# Patient Record
Sex: Female | Born: 1980 | Race: White | Hispanic: No | Marital: Single | Smoking: Current every day smoker
Health system: Southern US, Community
[De-identification: ages and names within clinical notes are randomized; demographics above are authoritative.]

## PROBLEM LIST (undated history)

## (undated) DIAGNOSIS — F419 Anxiety disorder, unspecified: Secondary | ICD-10-CM

## (undated) DIAGNOSIS — Z8711 Personal history of peptic ulcer disease: Secondary | ICD-10-CM

## (undated) DIAGNOSIS — Z8719 Personal history of other diseases of the digestive system: Secondary | ICD-10-CM

## (undated) DIAGNOSIS — G4730 Sleep apnea, unspecified: Secondary | ICD-10-CM

## (undated) DIAGNOSIS — T4145XA Adverse effect of unspecified anesthetic, initial encounter: Secondary | ICD-10-CM

## (undated) DIAGNOSIS — T8859XA Other complications of anesthesia, initial encounter: Secondary | ICD-10-CM

## (undated) DIAGNOSIS — R011 Cardiac murmur, unspecified: Secondary | ICD-10-CM

## (undated) HISTORY — PX: TUBAL LIGATION: SHX77

## (undated) HISTORY — PX: OTHER SURGICAL HISTORY: SHX169

## (undated) HISTORY — PX: BACK SURGERY: SHX140

---
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
## 2001-01-24 ENCOUNTER — Ambulatory Visit (HOSPITAL_COMMUNITY): Admission: RE | Admit: 2001-01-24 | Discharge: 2001-01-24 | Payer: Self-pay | Admitting: Obstetrics and Gynecology

## 2001-01-24 ENCOUNTER — Encounter: Payer: Self-pay | Admitting: Obstetrics and Gynecology

## 2001-03-11 ENCOUNTER — Encounter: Payer: Self-pay | Admitting: Obstetrics and Gynecology

## 2001-03-11 ENCOUNTER — Ambulatory Visit (HOSPITAL_COMMUNITY): Admission: RE | Admit: 2001-03-11 | Discharge: 2001-03-11 | Payer: Self-pay | Admitting: Obstetrics and Gynecology

## 2001-03-21 ENCOUNTER — Inpatient Hospital Stay (HOSPITAL_COMMUNITY): Admission: AD | Admit: 2001-03-21 | Discharge: 2001-03-25 | Payer: Self-pay | Admitting: Obstetrics and Gynecology

## 2001-03-21 ENCOUNTER — Encounter: Payer: Self-pay | Admitting: Obstetrics and Gynecology

## 2001-05-16 ENCOUNTER — Inpatient Hospital Stay (HOSPITAL_COMMUNITY): Admission: AD | Admit: 2001-05-16 | Discharge: 2001-05-19 | Payer: Self-pay | Admitting: Obstetrics and Gynecology

## 2001-09-17 ENCOUNTER — Other Ambulatory Visit: Admission: RE | Admit: 2001-09-17 | Discharge: 2001-09-17 | Payer: Self-pay | Admitting: Obstetrics and Gynecology

## 2001-11-01 ENCOUNTER — Emergency Department (HOSPITAL_COMMUNITY): Admission: EM | Admit: 2001-11-01 | Discharge: 2001-11-01 | Payer: Self-pay | Admitting: Emergency Medicine

## 2002-11-25 ENCOUNTER — Other Ambulatory Visit: Admission: RE | Admit: 2002-11-25 | Discharge: 2002-11-25 | Payer: Self-pay | Admitting: Obstetrics and Gynecology

## 2003-02-18 ENCOUNTER — Encounter (INDEPENDENT_AMBULATORY_CARE_PROVIDER_SITE_OTHER): Payer: Self-pay | Admitting: Specialist

## 2003-02-18 ENCOUNTER — Ambulatory Visit (HOSPITAL_BASED_OUTPATIENT_CLINIC_OR_DEPARTMENT_OTHER): Admission: RE | Admit: 2003-02-18 | Discharge: 2003-02-18 | Payer: Self-pay | Admitting: Surgery

## 2003-02-18 ENCOUNTER — Ambulatory Visit (HOSPITAL_COMMUNITY): Admission: RE | Admit: 2003-02-18 | Discharge: 2003-02-18 | Payer: Self-pay | Admitting: Surgery

## 2004-03-03 ENCOUNTER — Emergency Department (HOSPITAL_COMMUNITY): Admission: EM | Admit: 2004-03-03 | Discharge: 2004-03-03 | Payer: Self-pay | Admitting: Emergency Medicine

## 2004-04-26 ENCOUNTER — Other Ambulatory Visit: Admission: RE | Admit: 2004-04-26 | Discharge: 2004-04-26 | Payer: Self-pay | Admitting: Obstetrics and Gynecology

## 2004-05-04 ENCOUNTER — Ambulatory Visit (HOSPITAL_COMMUNITY): Admission: RE | Admit: 2004-05-04 | Discharge: 2004-05-04 | Payer: Self-pay | Admitting: Obstetrics and Gynecology

## 2005-05-03 ENCOUNTER — Other Ambulatory Visit: Admission: RE | Admit: 2005-05-03 | Discharge: 2005-05-03 | Payer: Self-pay | Admitting: Obstetrics and Gynecology

## 2005-07-12 ENCOUNTER — Inpatient Hospital Stay (HOSPITAL_COMMUNITY): Admission: AD | Admit: 2005-07-12 | Discharge: 2005-07-12 | Payer: Self-pay | Admitting: Obstetrics and Gynecology

## 2005-09-14 ENCOUNTER — Inpatient Hospital Stay (HOSPITAL_COMMUNITY): Admission: AD | Admit: 2005-09-14 | Discharge: 2005-09-14 | Payer: Self-pay | Admitting: Obstetrics and Gynecology

## 2005-11-09 ENCOUNTER — Inpatient Hospital Stay (HOSPITAL_COMMUNITY): Admission: AD | Admit: 2005-11-09 | Discharge: 2005-11-09 | Payer: Self-pay | Admitting: Obstetrics and Gynecology

## 2005-11-12 ENCOUNTER — Inpatient Hospital Stay (HOSPITAL_COMMUNITY): Admission: AD | Admit: 2005-11-12 | Discharge: 2005-11-15 | Payer: Self-pay | Admitting: Obstetrics and Gynecology

## 2005-11-13 ENCOUNTER — Encounter (INDEPENDENT_AMBULATORY_CARE_PROVIDER_SITE_OTHER): Payer: Self-pay | Admitting: *Deleted

## 2007-08-21 ENCOUNTER — Encounter: Admission: RE | Admit: 2007-08-21 | Discharge: 2007-08-21 | Payer: Self-pay | Admitting: Orthopedic Surgery

## 2008-12-05 ENCOUNTER — Emergency Department (HOSPITAL_COMMUNITY): Admission: EM | Admit: 2008-12-05 | Discharge: 2008-12-05 | Payer: Self-pay | Admitting: Emergency Medicine

## 2008-12-05 ENCOUNTER — Emergency Department (HOSPITAL_COMMUNITY): Admission: EM | Admit: 2008-12-05 | Discharge: 2008-12-05 | Payer: Self-pay | Admitting: Family Medicine

## 2009-05-25 ENCOUNTER — Emergency Department (HOSPITAL_COMMUNITY): Admission: EM | Admit: 2009-05-25 | Discharge: 2009-05-25 | Payer: Self-pay | Admitting: Family Medicine

## 2009-10-05 ENCOUNTER — Emergency Department (HOSPITAL_COMMUNITY): Admission: EM | Admit: 2009-10-05 | Discharge: 2009-10-05 | Payer: Self-pay | Admitting: Emergency Medicine

## 2010-04-23 ENCOUNTER — Emergency Department (HOSPITAL_COMMUNITY)
Admission: EM | Admit: 2010-04-23 | Discharge: 2010-04-23 | Payer: Self-pay | Source: Home / Self Care | Admitting: Family Medicine

## 2010-08-19 ENCOUNTER — Emergency Department (HOSPITAL_COMMUNITY)
Admission: EM | Admit: 2010-08-19 | Discharge: 2010-08-20 | Disposition: A | Discharge status: Home / Self Care | Payer: 59 | Attending: Emergency Medicine | Admitting: Emergency Medicine

## 2010-08-19 DIAGNOSIS — Y92009 Unspecified place in unspecified non-institutional (private) residence as the place of occurrence of the external cause: Secondary | ICD-10-CM | POA: Insufficient documentation

## 2010-08-19 DIAGNOSIS — S51809A Unspecified open wound of unspecified forearm, initial encounter: Secondary | ICD-10-CM | POA: Insufficient documentation

## 2010-08-19 DIAGNOSIS — W268XXA Contact with other sharp object(s), not elsewhere classified, initial encounter: Secondary | ICD-10-CM | POA: Insufficient documentation

## 2010-08-20 LAB — COMPREHENSIVE METABOLIC PANEL
ALT: 29 U/L (ref 0–35)
ALT: 32 U/L (ref 0–35)
AST: 19 U/L (ref 0–37)
Albumin: 3.6 g/dL (ref 3.5–5.2)
Alkaline Phosphatase: 55 U/L (ref 39–117)
Alkaline Phosphatase: 70 U/L (ref 39–117)
BUN: 16 mg/dL (ref 6–23)
Calcium: 7.7 mg/dL — ABNORMAL LOW (ref 8.4–10.5)
Chloride: 106 mEq/L (ref 96–112)
GFR calc Af Amer: >60 mL/min (ref >60)
Glucose, Bld: 106 mg/dL — ABNORMAL HIGH (ref 70–99)
Glucose, Bld: 93 mg/dL (ref 70–99)
Potassium: 3.7 mEq/L (ref 3.5–5.1)
Potassium: 3.7 mEq/L (ref 3.5–5.1)
Sodium: 136 mEq/L (ref 135–145)
Sodium: 136 mEq/L (ref 135–145)
Total Bilirubin: 0.8 mg/dL (ref 0.3–1.2)
Total Protein: 6.1 g/dL (ref 6.0–8.3)

## 2010-08-20 LAB — DIFFERENTIAL
Basophils Absolute: 0 10*3/uL (ref 0.0–0.1)
Basophils Relative: 0 % (ref 0–1)
Basophils Relative: 0 % (ref 0–1)
Eosinophils Absolute: 0 10*3/uL (ref 0.0–0.7)
Eosinophils Absolute: 0 10*3/uL (ref 0.0–0.7)
Eosinophils Relative: 0 % (ref 0–5)
Lymphs Abs: 0.6 10*3/uL — ABNORMAL LOW (ref 0.7–4.0)
Monocytes Absolute: 0.3 10*3/uL (ref 0.1–1.0)
Monocytes Absolute: 0.4 10*3/uL (ref 0.1–1.0)
Monocytes Relative: 3 % (ref 3–12)
Monocytes Relative: 6 % (ref 3–12)
Neutro Abs: 9.5 10*3/uL — ABNORMAL HIGH (ref 1.7–7.7)
Neutrophils Relative %: 86 % — ABNORMAL HIGH (ref 43–77)
Neutrophils Relative %: 90 % — ABNORMAL HIGH (ref 43–77)

## 2010-08-20 LAB — CBC
HCT: 47 % — ABNORMAL HIGH (ref 36.0–46.0)
Hemoglobin: 13.6 g/dL (ref 12.0–15.0)
Hemoglobin: 16 g/dL — ABNORMAL HIGH (ref 12.0–15.0)
MCHC: 34.2 g/dL (ref 30.0–36.0)
RBC: 4.18 MIL/uL (ref 3.87–5.11)
RBC: 4.95 MIL/uL (ref 3.87–5.11)
RDW: 13 % (ref 11.5–15.5)
WBC: 10.5 10*3/uL (ref 4.0–10.5)

## 2010-09-29 NOTE — Op Note (Signed)
NAMEMARIONETTE, [name]                 ACCOUNT NO.:  [id number]   MEDICAL RECORD NO.:  [id number]          PATIENT TYPE:  INP   LOCATION:  9148                          FACILITY:  [facility]   PHYSICIAN:  Naima A. Dillard, M.D. DATE OF BIRTH:  [birth year]   DATE OF PROCEDURE:  11/13/2005  DATE OF DISCHARGE:                                 OPERATIVE REPORT   PREOPERATIVE DIAGNOSIS:  Multiparity, desires postpartum tubal ligation.   POSTOPERATIVE DIAGNOSIS:  Multiparity, desires postpartum tubal ligation.   PROCEDURE:  Postpartum tubal ligation.   SURGEON:  Naima A. Normand Sloop, M.D.   ANESTHESIA:  General endotracheal anesthesia.   ESTIMATED BLOOD LOSS:  Minimal.   COMPLICATIONS:  None.   SPECIMENS:  Portions of left and right tubes sent to pathology.   DISPOSITION:  Patient to the recovery room in stable condition.   PROCEDURE IN DETAIL:  Before the procedure, the patient was told the risks  of but not limited to bleeding, infection, damage to internal organs such  bowel, bladder, and major blood vessels, and problems with anesthesia.  She  was also told the risks of failure of about 1 to 200 to 1 in 300 and the  patient agreed to the procedure.   The patient was taken to the operating room where her epidural anesthesia  was not found to be adequate.  She was given general anesthesia with  endotracheal tube.  She was placed in the dorsal lithotomy position and  prepped and draped in a normal sterile fashion.  An infraumbilical incision  was made with the scalpel after 0.5% Bupivacaine with epinephrine was  injected, 5 mL, and taken down to the fascia.  The fascia was then incised  and extended bilaterally.  The peritoneum was identified, entered sharply,  and extended bilaterally.  The patient's bowel was very prominent, so two  Deavers were placed in the abdomen to move away the bowel.  The patient's  right fallopian tube was then grasped with a Babcock clamp, followed out to  its  fimbriated end, and about a cm of the midisthmic portion of the tube was  ligated with 2-0 plain and excised.  Hemostasis was assured.  The patient's  bowel was then repacked.  Attention was turned to the left fallopian tube.  This was difficult to see because of the bowel in the way.  Two small  Deavers were used thus packing the bowel and finally the left tube was seen,  grasped with Babcock clamps, and followed out to the fimbriated end.  The  mid isthmic portion of the tube, about 1 cm, was ligated with 2-0 plain and  excised.  Hemostasis was assured.  The tubes were placed back in the  abdomen.  The instruments were  removed.  The fascia was closed with 0 Vicryl in a running fashion.  The  skin was closed with 3-0 Monocryl in a subcuticular fashion.  Sponge, lap,  and needle counts were correct.  The patient went to the recovery room in  stable condition.      Naima A. Normand Sloop, M.D.  Electronically  Signed     NAD/MEDQ  D:  11/13/2005  T:  11/13/2005  Job:  [id number]

## 2010-09-29 NOTE — H&P (Signed)
[name], [name]                 ACCOUNT NO.:  [id number]   MEDICAL RECORD NO.:  [id number]          PATIENT TYPE:  INP   LOCATION:  9162                          FACILITY:  [facility]   PHYSICIAN:  Osborn Coho, M.D.   DATE OF BIRTH:  10/10/1980   DATE OF ADMISSION:  11/12/2005  DATE OF DISCHARGE:                                HISTORY & PHYSICAL   HISTORY OF PRESENT ILLNESS:  [name] is a 30 year old gravida 4, para 1-0-  2-1 at 17 and 2/7ths weeks who presented from the office with spontaneous  rupture of membranes at approximately 3:30 p.m. with clear fluid noted.  She  was having uterine contractions approximately every 6 minutes.  Her cervix  was 3 cm, 60%, vertex -2 in the office.   The pregnancy has been remarkable for:  1.  Smoker.  2.  TAB x2.  3.  History of sleep apnea, now resolved.  4.  Mild asthma.  5.  Migraines.  6.  PENICILLIN allergy.   PRENATAL LABORATORIES:  Blood type is A positive.  Rh antibody negative.  VDRL non-reactive.  Rubella titer positive.  Hepatitis B surface antigen  negative.  Cystic fibrosis testing was negative.  HIV was declined.  Pap  smear was normal.  GC and Chlamydia cultures were negative.  Quadruple  screen was declined.  Glucola was normal.  Hemoglobin upon entering the  practice was 12.6 (it was 11.3 at 26 weeks).  Group B strep culture was  negative at [redacted] weeks along with other cultures.   Estimated date of confinement of November 17, 2005 was established by a 10-week  ultrasound and was in agreement with ultrasound at approximately 18 weeks.   HISTORY OF PRESENT PREGNANCY:  The patient entered care at approximately 10  weeks.  She had an ultrasound at that time for dating.  She was treat for BV  at that visit.  She had an 18-week ultrasound which showed normal growth and  development, but a posterior previa.  She declined quadruple screen.  She  began to have headaches at approximately 19 weeks.  She was evaluated by a  neurologist  and was placed on Vicodin.  At 22 weeks, she had another  ultrasound showing her previa resolved.  She did have some gum hyperplasia  noted at 22 weeks.  She did seen a periodontist, and this was anticipated to  resolve after delivery.  Her Glucola was normal.  The headaches continued  during her pregnancy.  She did have some pica of ice, but this seemed to be  under reasonable control.  Group B strep culture was negative.  She was seen  here at maternity admissions last week for a labor check.  At that time, she  was 3 cm.  Her cervix did not change, and she was sent home.   OBSTETRICAL HISTORY:  1.  In 1999, she had a therapeutic termination of pregnant that is      confidential.  She did have depression following that.  2.  In 2003, she had a vaginal delivery of a female infant,  weight 8 pounds,      2 ounces, at 40 weeks.  She was in labor 16 hours.  She was diagnosed      with obstructive sleep apnea during that pregnancy.  3.  In 2005, she had a therapeutic termination of pregnancy at 5 weeks that      is confidential.   MEDICAL HISTORY:  She was on Ortho Evra in the past.  She discontinued this  in November of 2006.  She did conceive on Ortho Evra.  She was on  antibiotics.   GYNECOLOGICAL SURGERY:  1.  The previously-noted TAB x2.  2.  She had her wisdom teeth removed in 2002.  3.  Plantar's wart removed off her right foot in 1999.   MEDICAL HISTORY:  1.  She reports the usual childhood illnesses.  2.  In 2003, she was diagnosed with asthma, but has not required any      medication.  3.  She has occasional urinary tract infections.  4.  She had pyelonephritis in 1999.  5.  Hospitalization for childbirth.   SOCIAL HISTORY:  She has been a smoker since age 66.  The patient is single.  Father of the baby is involved and supportive.  His name is Shadaya Marschner.  The patient is high school educated.  She is a Research scientist (medical).  Her partner is also high school educated.  He  is employed in some type of  production.  She has been followed by the certified nurse midwife service a  [place].  She denies any alcohol or drug use during this  pregnancy.  She has been a smoker.   FAMILY HISTORY:  Her mother, father and maternal grandmother have chronic  hypertension.  Her mother has varicosities.  Her sister has anemia and  questionable low platelets.  Maternal grandmother had emphysema.  Her sister  also has frequent pyelonephritis and migraines.  Her maternal grandfather  had lung cancer and is now deceased.  There are numerous family members who  smoke.  There are several family members who drink alcohol to excess.   GENETIC HISTORY:  Remarkable for the patient's cousin having muscular  dystrophy.   ALLERGIES:  She is allergic to PENICILLIN, which causes a rash.   PHYSICAL EXAMINATION:  VITAL SIGNS:  Stable.  The patient is afebrile.  HEENT:  Within normal limits.  LUNGS:  Her breath sounds are clear.  HEART:  Regular rate and rhythm without murmur.  BREASTS:  Soft and nontender.  ABDOMEN:  Fundal height is approximately 38 cm.  Estimated fetal weight is  7.5 to 8 pounds.  Uterine contractions are every 6 minutes, mild to moderate  in quality.  PELVIC:  Cervical exam in the office was 3 cm, 60%, vertex at a -2 station,  by [employer].  The patient is noted to be leaking clear fluid.  Fetal heart  rate is reactive with no decelerations.  EXTREMITIES:  Deep tendon reflexes are 2+ without clonus.  There is a trace  edema noted.   IMPRESSION:  1.  Intrauterine pregnancy at 52 and 2/7ths weeks.  2.  Early labor with spontaneous rupture of membranes.  3.  Negative group B strep.   PLAN:  1.  Admit to birthing suite for consultation with Dr. Su Hilt as attending      physician.  2.  Routine certified nurse midwife orders.  3.  Will reevaluate in 1-2 hours for possible Pitocin augmentation. 4.  The patient  will plan epidural for labor.       Taylor Huff, C.N.M.      Osborn Coho, M.D.  Electronically Signed    VLL/MEDQ  D:  11/12/2005  T:  11/12/2005  Job:  [id number]

## 2010-09-29 NOTE — Discharge Summary (Signed)
[facility]  Patient:    Taylor Huff, Taylor Huff Visit Number: [id number] MRN: [id number]          Service Type: OBS Location: 910B 9162 01 Attending Physician:  Shaune Spittle Dictated by:   Pierre Bali Normand Sloop, M.D. Admit Date:  03/21/2001 Discharge Date: 03/25/2001                             Discharge Summary  DIAGNOSES:                    1. Upper respiratory infection/bronchitis at 32+                                  weeks of pregnancy.                               2. Sleep apnea.  HISTORY OF PRESENT ILLNESS:   The patient is a 30 year old G2 P0, 0-1-0, who presented at 32 weeks with a 24 hour history of nausea, vomiting, and chest pressure, also complaining of just congestion and upper respiratory infection with a temperature of about 100.9 degrees at home.  HOSPITAL COURSE:              The patient was brought in with pneumonia versus rule out PE.  Her ABG was found to be normal.  Chest x-ray was within normal limits.  Her WBC, however, was 20,000 with a left shift.  The patient was thus started on Rocephin and Zithromax, and she defervesced with antibiotics. Throughout her hospital stay the baby remained reactive on the fetal monitor. On hospital day #3 the patient complained of some head congestion and nonproductive cough.  Her O2 saturation ranged from 89-97% while sleeping and while awake was 94-96%.  Because of the desaturation to 89% she was given 3 liters nasal cannula.  The patient said she felt better and had no chest pain or shortness of breath; however, secondary to her continuing to desaturate despite antibiotics, pulmonary consultation was obtained.  The pulmonologist agreed with treatment and felt that she had sleep-related O2 desaturation secondary to nasal congestion of URI plus gravid shallow respiration, and also felt she may have sleep apnea.  DISPOSITION:                  The patient was discharged home to receive  CPAP at home.  She is to follow up with [place] on Thursday, March 27, 2001, in the office.  She was given respiratory precautions and PML precautions, and sent home. Dictated by:   Pierre Bali. Normand Sloop, M.D. Attending Physician:  Shaune Spittle DD:  03/25/01 TD:  03/26/01 Job: [id number]

## 2010-09-29 NOTE — Discharge Summary (Signed)
NAMEABBEYGAIL, IGOE                 ACCOUNT NO.:  [id number]   MEDICAL RECORD NO.:  [id number]          PATIENT TYPE:  INP   LOCATION:  9162                          FACILITY:  [facility]   PHYSICIAN:  Osborn Coho, M.D.   DATE OF BIRTH:  1981/02/28   DATE OF ADMISSION:  11/12/2005  DATE OF DISCHARGE:                                 DISCHARGE SUMMARY   Ms. Memory Argue is a 30 year old gravida 4, para 1-0-2-1 at 60 and 4/7 weeks who  presented from the office with spontaneous rupture of membranes at  approximately 3:30 p.m. with clear fluid noted. Irregular contractions were  noted.  Cervix in the office is 3 cm, 60%, vertex and a -2. Pregnancy has  been remarkable for:  1) smoker; 2) TAB x2; 3) history of sleep apnea, now  resolved; 4) mild asthma; 5) migraines.   Prenatal labs:  Blood type is A positive, Rh antibody negative, VDRL  nonreactive, rubella titer positive, hepatitis B surface antigen negative,  cystic fibrosis testing was negative, HIV was declined.  GC and Chlamydia  cultures were negative in the first trimester and at 36 weeks. Pap was  normal.  Group B strep culture was negative at 36 weeks, Glucola was normal.  Quadruple screen was declined.  EDC of   Dictation ended at this point.      Renaldo Reel Emilee Hero, C.N.M.      Osborn Coho, M.D.  Electronically Signed    VLL/MEDQ  D:  11/12/2005  T:  11/12/2005  Job:  [id number]

## 2010-09-29 NOTE — Discharge Summary (Signed)
[name], [name]                 ACCOUNT NO.:  [id number]   MEDICAL RECORD NO.:  [id number]          PATIENT TYPE:  INP   LOCATION:  9148                          [facility]  [facility]   PHYSICIAN:  Crist Fat. Rivard, M.D. DATE OF BIRTH:  [birth year]   DATE OF ADMISSION:  11/12/2005  DATE OF DISCHARGE:  11/15/2005                                 DISCHARGE SUMMARY   ADMITTING DIAGNOSES:  1.  Intrauterine pregnancy at term, active labor.  2.  Desires postpartum tubal ligation.   DISCHARGE DIAGNOSES:  1.  Intrauterine pregnancy at term, delivered.  2.  Status post normal spontaneous vaginal delivery.  3.  Postpartum sterilization.   PROCEDURES:  1.  Spontaneous vaginal birth.  2.  Repair of first-degree perineal and right labial laceration.  3.  Postpartum bilateral tubal ligation.   HOSPITAL COURSE:  Mrs. Taylor Huff is a 30 year old gravida 4, para 1-0-2-1 who  presented with spontaneous rupture of membranes in early labor on November 12, 2005.  Patient had been remarkable for:  1.  Tobacco use.  2.  TAB x2.  3.  History of sleep apnea.  4.  Mild asthma.  5.  History of migraines.  6.  Desires bilateral tubal ligation.   Patient progressed to complete dilation and underwent spontaneous vaginal  birth of a viable female infant, Taylor Huff, with Apgars of 9 and 9 and weight 7  pounds 14 ounces.  Patient is bottle feeding.  Hemoglobin was 8.9 on  postpartum day #1, which was decreased from 9.8 on admission.  Patient  desired postpartum bilateral tubal ligation and this was performed by Dr.  Normand Sloop.  No complications from postpartum tubal ligation.  Circumcision to  be performed by Dr. Dois Davenport Rivard.   By postpartum day #2, patient was doing well and deemed to have received the  full benefit of her hospital stay.  She was discharged home.   DISCHARGE CONDITION:  Stable.  Discharge instructions per [facility] handout.   DISCHARGE MEDICATIONS:  1.  Motrin 600 mg p.o. q.6 h. p.r.n.  pain.  2.  Tylox 1-2 tabs p.o. q.4-6 h, p.r.n. pain.  3.  Prenatal vitamins 1 tab daily.  4.  Tandem F 1 tablet daily.   Discharge followup will occur at 6 weeks' postpartum at [facility].      Rhona Leavens, CNM      Crist Fat Rivard, M.D.  Electronically Signed    NOS/MEDQ  D:  11/15/2005  T:  11/15/2005  Job:  [id number]

## 2010-09-29 NOTE — H&P (Signed)
[facility]  Patient:    Taylor Huff, Taylor Huff Visit Number: [id number] MRN: [id number]          Service Type: OBS Location: MATC Attending Physician:  Shaune Spittle Dictated by:   Nigel Bridgeman, C.N.M. Admit Date:  03/21/2001                           History and Physical  HISTORY OF PRESENT ILLNESS:   The patient is a 30 year old gravida 2, para 0-0-1-0, at 32 weeks who presents with 24 hour history of nausea, vomiting, and upper respiratory symptoms.  Now with onset of shortness of breath today and chest pressure.  No hemoptysis is noted.  Positive nasal congestion and sore throat are reported by the patient.  There is another child in the home who has an upper respiratory infection.  The patient denies uterine contractions, cramping, dysuria, or leg pain.  She has been unable to keep food or fluids down for 24 hours.  Her temperature was 100.9 at home. Pregnancy has been remarkable for:  1. Previous smoker before pregnancy. 2. Questionable dates with EDC by seven week ultrasound. 3. Echogenic intracardiac focus on ultrasound. 4. Penicillin allergy with rash as a child. 5. Questionable pyelo approximately three years ago.  PRENATAL LABORATORY DATA:     Blood type is A-positive, Rh antibody negative. VDRL nonreactive.  Rubella titer positive.  Hepatitis B surface antigen negative.  HIV nonreactive.  GC and Chlamydia cultures are negative.  Pap was normal.  Glucose challenge was normal.  EDC of May 16, 2001 was established by seven week ultrasound secondary to questionable LMP.  This was also confirmed by ultrasound at 18 weeks.  HISTORY OF PRESENT PREGNANCY: The patient entered care at approximately seven to eight weeks.  She had an ultrasound during her first visit secondary to size less less than dates, which established an [facility] of May 16, 2001.  She had an ultrasound at 18 weeks which showed a left ventricle with an  echogenic intracardiac focus.  She had a follow up ultrasound at approximately 24 weeks which showed the same.  She had another ultrasound on March 11, 2001, that showed the echogenic intracardiac focus was still present, but was less prominent.  Growth at 26 weeks showed greater than the 90th percentile.  This did resolve to the 75th percentile at the last ultrasound at approximately 30 weeks.  The rest of her pregnancy was essentially uncomplicated.  OBSTETRICAL HISTORY:          In 1999, she had a pregnancy termination that is confidential at seven to eight weeks gestation.  PAST MEDICAL HISTORY:         She conceived on OCPs, and was on antibiotics at the time.  She had an abnormal Pap in June of 2001, but the repeat was normal. Reports yeast infections x 1.  She has a history of usual childhood illnesses. She has a history of one or two urinary tract infections per year and a questionable kidney infection three years ago.  This was outpatient treated with oral antibiotics.  PAST SURGICAL HISTORY:        Includes wisdom teeth removed two to three months prior to pregnancy, and plantar warts removed from her right foot. The patients other surgical history includes a previous termination of pregnancy.  ALLERGIES:                    PENICILLIN  which caused a rash as a child.  FAMILY HISTORY:               Her mother, father, and maternal grandfather all have hypertension.  Her mother has varicosities.  Her sister has anemia and questionable low platelets.  Her maternal grandmother had emphysema.  Her sister has frequent pyelonephritis.  Her sister also has migraines.  Her maternal grandfather had lung cancer and is now deceased.  Her maternal grandmother is a smoker and alcohol user, and maternal uncle is also an alcohol user.  GENETIC HISTORY:              Remarkable for a maternal first cousin having Down syndrome and died at age 29.  SOCIAL HISTORY:               The patient  is single.  The father of the baby is involved.  His name is Christena Deem.  She is Caucasian.  She has a 10th grade education.  She is employed at [employer].  Her partner has some college and is employed with a Education officer, environmental.  She has been followed by the certified nurse midwife service at [facility].  She denies any alcohol, drug, or tobacco use since her positive urine pregnancy test.  PHYSICAL EXAMINATION:  VITAL SIGNS:                  Temperature is 98.9, pulse 127 to 137, respirations 24 to 30, O2 sat 97% on room air, blood pressure is 120/60.  HEENT:                        Remarkable for slightly reddened throat.  Ears are within normal limits.  Nares are normal.  HEART:                        Tachycardia, but a regular rate and rhythm without murmur.  CHEST:                        Bilateral breath sounds are clear, but are somewhat diminished in the lower lobes.  ABDOMEN:                      Soft and nontender, gravid with approximately 32 weeks size.  Fetal heart rate is reassuring.  Uterine irritability is noted.  CERVICAL EXAMINATION:         Pending.  Negative CVA tenderness is noted.  EXTREMITIES:                  Deep tendon reflexes are 2+ without clonus. There is a trace edema noted.  Negative Homans sign is noted.  LABORATORY DATA:              CBC shows WBC count of 20 with a hemoglobin of 10.9, hematocrit of 30.5, and platelets of 247.  Neutrophils are 85 and elevated.  Granulocytes were 17 and elevated.  Clean catch urine is pending. Comprehensive metabolic is pending.  IMPRESSION:                   1. Intrauterine pregnancy at 32 weeks.                               2. Questionable pneumonia, questionable rule out  pulmonary embolus.                               3. Elevated white blood cell count.  PLAN:                          1. Admit for 23 hour observation for consult                                   with  Dr. Normand Sloop as attending physician.                                2. ABGs.                                3. Chest x-ray, AP and lateral.                                 4. Breathing treatment with albuterol.                                5. Zithromax 500 mg p.o. q.d.                                6. Rocephin 1 g IV q.24.                                7. EKG.                                8. Diet as tolerated, would push p.o. fluids.                                9. Continuous _______ fetal monitoring.                               10. MDs will follow. Dictated by:   Nigel Bridgeman, C.N.M. Attending Physician:  Shaune Spittle DD:  03/21/01 TD:  03/21/01 Job: [id number]

## 2010-09-29 NOTE — H&P (Signed)
[facility]  Patient:    Taylor Huff, Taylor Huff Visit Number: [id number] MRN: [id number]          Service Type: OBS Location: 910B 9154 01 Attending Physician:  Shaune Spittle Dictated by:   Nigel Bridgeman, C.N.M. Admit Date:  05/16/2001                           History and Physical  HISTORY:                      [name] is a 30 year old gravida 2, para 0-0-1-0 at 40 weeks who presents from office with spontaneous ruptured membranes approximately 8:30 a.m. this morning with clear fluid noted and uterine contractions every four to eight minutes.  Cervix was 2 cm in the office.  Pregnancy has been remarkable for conception on OCPs, echogenic intracardiac focus on left ventricle via ultrasound, questionable LGA on ultrasound at 26 weeks, but growth at the 75th percentile at 30 weeks, unsure dates, allergy to penicillin.  PRENATAL LABORATORIES:        Blood type A+.  Rh antibody negative.  VDRL nonreactive.  Rubella titer positive.  Hepatitis B surface antigen negative. HIV nonreactive.  GC and chlamydia cultures negative.  Pap normal.  Glucose challenge normal.  AFP is not noted on her chart.  Group B Strep culture was negative at 36 weeks.  [facility] May 16, 2001 was established by 7 week ultrasound and was in agreement with ultrasound at approximately 18 weeks. Hemoglobin upon entry into practice was 13.2.  It was within normal limits at 28 weeks.  HISTORY OF PRESENT PREGNANCY: Patient entered care at approximately 7 weeks by ultrasound.  She had questionable LMP, therefore an ultrasound was done at that 7 week time period.  She had another ultrasound at 18 weeks which showed an echogenic intracardiac focus in the left ventricle.  A follow-up ultrasound was again at 30 weeks.  The rest of her pregnancy was essentially uncomplicated.  PAST OBSTETRICAL HISTORY:     In 1999 she had a therapeutic termination of pregnancy which is confidential.  She  did have some depression following that.  PAST MEDICAL HISTORY:         She conceived on OCPs with antibiotic therapy. She had an abnormal Pap in June 2001 with a repeat normal.  She has occasional yeast infections.  She reports the usual childhood illnesses.  She did have shingles just prior to pregnancy.  She has had one to two UTIs per year.  She had pyelonephritis three years ago.  PAST SURGICAL HISTORY:        Therapeutic termination of pregnancy, wisdom teeth removed two to three months prior to pregnancy, plantar warts removed from her right foot.  ALLERGIES:                    PENICILLIN which causes a rash.  FAMILY HISTORY:               Her mother, father, and maternal grandmother have hypertension.  Her mother has varicosities.  Her sister has anemia and questionable issue with platelets.  Maternal grandmother has emphysema. Sister has frequent pyelonephritis.  Sister has migraines.  Maternal grandfather had lung cancer and is now deceased.  Maternal grandmother is a smoker and an alcohol user.  Maternal uncle is also an alcohol user.  GENETIC HISTORY:  Remarkable for maternal first cousin dying at age 63 with Down syndrome and questionable muscular dystrophy.  SOCIAL HISTORY:               Patient is single.  The father of the baby is involved and supportive.  His name is Taylor Huff.  Patient is also supported by her mother and her sister.  She is Caucasian.  She has a 10th grade education.  She is employed at [employer].  Her partner has a Naval architect.  He is employed at a [employer]/textile [employer].  She has been followed by the certified nurse midwife service at [facility].  She denies any alcohol, drug, or tobacco use during this pregnancy.  She was a previous smoker prior to pregnancy.  PHYSICAL EXAMINATION  VITAL SIGNS:                  Stable.  Patient is afebrile.  HEENT:                        Within normal limits.  LUNGS:                         Bilateral breath sounds are clear.  HEART:                        Regular rate and rhythm without murmur.  BREASTS:                      Soft and nontender.  ABDOMEN:                      Fundal height is 38 cm.  Estimated fetal weight 7.5-8 pounds.  Uterine contractions are every four to eight minutes, mild quality.  Patient is leaking clear fluid.  PELVIC:                       Cervical examination is deferred at present secondary to being performed in the office with 2 cm, 80%, vertex presentation at a -1 station.  Fetal heart rate is reactive with no decelerations.  EXTREMITIES:                  Deep tendon reflexes are 2+ without clonus. There is a trace edema noted.  IMPRESSION:                   1. Intrauterine pregnancy at 40 weeks.                               2. Spontaneous rupture of membranes.                               3. Echogenic intracardiac focus on ultrasound.  PLAN:                         1. Admit to birthing suite for consult with Dr.                                  Dierdre Forth as attending physician.  2. Routine certified nurse midwife orders.                               3. Will observe for increased uterine                                  contractions over the next two to three hours                                  and plan Pitocin augmentation p.r.n.                               4. Patient plans epidural as labor progresses. Dictated by:   Nigel Bridgeman, C.N.M. Attending Physician:  Shaune Spittle DD:  05/16/01 TD:  05/16/01 Job: [id number]

## 2010-09-29 NOTE — Op Note (Signed)
[facility]  Patient:    Taylor Huff, Taylor Huff Visit Number: [id number] MRN: [id number]          Service Type: OBS Location: 910A 9129 01 Attending Physician:  Shaune Spittle Dictated by:   Maris Berger. Pennie Rushing, M.D. Admit Date:  05/16/2001                             Operative Report  NO DICTATION. Dictated by:   Maris Berger. Pennie Rushing, M.D. Attending Physician:  Shaune Spittle DD:  05/16/01 TD:  05/17/01 Job: [id number]

## 2010-09-29 NOTE — Op Note (Signed)
   NAME:  Taylor Huff, Taylor Huff                           ACCOUNT NO.:  [id number]   MEDICAL RECORD NO.:  [id number]                   PATIENT TYPE:  AMB   LOCATION:  DSC                                  FACILITY:  [facility]   PHYSICIAN:  Abigail Miyamoto, M.D.              DATE OF BIRTH:  20-Dec-1980   DATE OF PROCEDURE:  02/18/2003  DATE OF DISCHARGE:                                 OPERATIVE REPORT   PREOPERATIVE DIAGNOSIS:  Anal skin tag.   POSTOPERATIVE DIAGNOSIS:  Anal skin tag.   PROCEDURE:  Excision of anal skin tag.   SURGEON:  Abigail Miyamoto, M.D.   ANESTHESIA:  General and 0.25% Marcaine with epinephrine.   ESTIMATED BLOOD LOSS:  Minimal.   DESCRIPTION OF PROCEDURE:  The patient was brought to the operating room and  identified as Taylor Huff.  She was placed supine on the operating table and  general anesthesia was induced.  The patient was then placed in the  lithotomy position.  Her perianal area was then prepped and draped in the  usual sterile fashion.  The patient was found to have a large anterior skin  and a smaller posterior skin tag.  The anterior skin tag was grasped with an  Allis clamp and elevated and completely excised with electrocautery.  The  posterior anal skin tag was likewise grasped with an Allis and excised with  electrocautery as well.  The skin was then reapproximated with interrupted 3-  0 chromic sutures.  The perianal area was then anesthetized with 0.25%  Marcaine with epinephrine.  Gauze was then applied over the wound.  The  patient tolerated the procedure well.  All counts were correct at the end of  the procedure.  The patient was then extubated in the operating room and  taken to the recovery room.                                               Abigail Miyamoto, M.D.    DB/MEDQ  D:  02/18/2003  T:  02/18/2003  Job:  [id number]

## 2011-10-31 ENCOUNTER — Ambulatory Visit (INDEPENDENT_AMBULATORY_CARE_PROVIDER_SITE_OTHER): Payer: 59 | Admitting: Family Medicine

## 2011-10-31 VITALS — BP 93/63 | HR 55 | Temp 98.4°F | Resp 16 | Ht 67.0 in | Wt 163.0 lb

## 2011-10-31 DIAGNOSIS — F329 Major depressive disorder, single episode, unspecified: Secondary | ICD-10-CM

## 2011-10-31 DIAGNOSIS — F419 Anxiety disorder, unspecified: Secondary | ICD-10-CM

## 2011-10-31 DIAGNOSIS — M79609 Pain in unspecified limb: Secondary | ICD-10-CM

## 2011-10-31 DIAGNOSIS — F411 Generalized anxiety disorder: Secondary | ICD-10-CM

## 2011-10-31 DIAGNOSIS — M79676 Pain in unspecified toe(s): Secondary | ICD-10-CM

## 2011-10-31 DIAGNOSIS — S99929A Unspecified injury of unspecified foot, initial encounter: Secondary | ICD-10-CM

## 2011-10-31 MED ORDER — SERTRALINE HCL 50 MG PO TABS: 50.0000 mg | ORAL_TABLET | Freq: Every day | ORAL | Status: DC

## 2011-10-31 MED ORDER — ALPRAZOLAM 1 MG PO TABS: 0.2500 mg | ORAL_TABLET | Freq: Two times a day (BID) | ORAL | Status: AC | PRN

## 2011-10-31 NOTE — Progress Notes (Signed)
Patient Name: Taylor Huff Date of Birth: 01/20/1981 Medical Record Number: [id number] Gender: female Date of Encounter: 10/31/2011  History of Present Illness:  Taylor Huff is a 31 y.o. very pleasant female patient who presents with the following:  Yesterday she was pulling a log- the log went through her left shoe and hurt her left toe- it damaged the nail. A piece of wood went up under the nail, partially avulsing it from the nail- bed.  She pulled this piece of wood out.  He toe is still throbbing.  Her last tetanus was about one year ago.    She is status post BTL  She also stopped her sertraline but would like to go back on this.  She did best with the 50mg - liked it better than 100mg .  She has been off for several months.  She has felt a little bit down again, and stress is getting to her more.  She would also like to go back on xanax which she has taken in the past.  She states that she might take the xanax a few times a week, and it helps with her nerves.  No serious depression or SI.   There is no problem list on file for this patient.  No past medical history on file. No past surgical history on file. History  Substance Use Topics  . Smoking status: Current Everyday Smoker -- 0.2 packs/day for 10 years    Types: Cigarettes  . Smokeless tobacco: Not on file  . Alcohol Use: Not on file   No family history on file. Allergies  Allergen Reactions  . Penicillins Swelling    Medication list has been reviewed and updated.  Prior to Admission medications   Not on File    Review of Systems:  As per HPI- otherwise negative.   Physical Examination: Filed Vitals:   10/31/11 1047  BP: 93/63  Pulse: 55  Temp: 98.4 F (36.9 C)  Resp: 16   Filed Vitals:   10/31/11 1047  Height: 5\' 7"  (1.702 m)  Weight: 163 lb (73.936 kg)   Body mass index is 25.53 kg/(m^2). Ideal Body Weight: Weight in (lb) to have BMI = 25: 159.3    GEN: WDWN, NAD, Non-toxic, Alert &  Oriented x 3 HEENT: Atraumatic, Normocephalic.  Ears and Nose: No external deformity. EXTR: No clubbing/cyanosis/edema NEURO: Normal gait.  PSYCH: Normally interactive. Conversant. Not depressed or anxious appearing.  Calm demeanor.  Left great toe: there is no bony tenderness, swelling or redness.  She does not desire an x-ray.  The distal portion of the nail is split and part is avulsed from the nailbed but still lying flat against the bed. No visible FB under nail, no sign of infection.    Assessment and Plan: 1. Depression  sertraline (ZOLOFT) 50 MG tablet  2. Anxiety  ALPRAZolam (XANAX) 1 MG tablet  3. Pain in toe    4. Injury of toe     Injury to toenail.  However, the nail is still well in place, and does not appear to be getting infected.  For now recommend watchful waiting- if any sign of infection she will start the doxy listed below and call us.  Protect nail from any trauma which could avulse it from the nailbed.   rx for doxy100 BID for 10 days to hold.   Also gave note to RTW next week.  She has to wear heavy boots to work and does not think  that she can yet.  Restarted her zoloft and alprazolam as above.  She will call in two weeks with an update- Sooner if worse.    Abbe Amsterdam, MD

## 2011-11-02 ENCOUNTER — Emergency Department (INDEPENDENT_AMBULATORY_CARE_PROVIDER_SITE_OTHER): Payer: 59

## 2011-11-02 ENCOUNTER — Emergency Department (HOSPITAL_COMMUNITY)
Admission: EM | Admit: 2011-11-02 | Discharge: 2011-11-02 | Disposition: A | Discharge status: Home / Self Care | Payer: 59 | Source: Home / Self Care | Attending: Emergency Medicine | Admitting: Emergency Medicine

## 2011-11-02 ENCOUNTER — Encounter (HOSPITAL_COMMUNITY): Payer: Self-pay | Admitting: Emergency Medicine

## 2011-11-02 DIAGNOSIS — S93409A Sprain of unspecified ligament of unspecified ankle, initial encounter: Secondary | ICD-10-CM

## 2011-11-02 DIAGNOSIS — S93402A Sprain of unspecified ligament of left ankle, initial encounter: Secondary | ICD-10-CM

## 2011-11-02 HISTORY — DX: Anxiety disorder, unspecified: F41.9

## 2011-11-02 MED ORDER — OXYCODONE-ACETAMINOPHEN 5-325 MG PO TABS: 1.0000 | ORAL_TABLET | Freq: Four times a day (QID) | ORAL | Status: AC | PRN

## 2011-11-02 MED ORDER — IBUPROFEN 800 MG PO TABS
800.0000 mg | ORAL_TABLET | Freq: Once | ORAL | Status: AC
  Administered 2011-11-02: 800 mg via ORAL

## 2011-11-02 MED ORDER — IBUPROFEN 800 MG PO TABS
ORAL_TABLET | ORAL | Status: AC
  Filled 2011-11-02: qty 1

## 2011-11-02 MED ORDER — IBUPROFEN 600 MG PO TABS: 600.0000 mg | ORAL_TABLET | Freq: Four times a day (QID) | ORAL | Status: AC | PRN

## 2011-11-02 NOTE — ED Notes (Signed)
Left ankle injury, rolled ankle while dancing in living room.  Incident occurred last night.

## 2011-11-02 NOTE — Discharge Instructions (Signed)
Take the medication as written. Take 1 gram of tylenol with the motrin up to 4 times a day as needed for pain and fever. This with the meloxicam is an effective combination for pain. Take the percocet only for severe pain. Do not take the tylenol and percocet as they both have tylenol in them and too much can hurt your liver. Do not exceed 4 grams of tylenol a day from all sources. Return if you get worse, have a  fever >100.4, or for any concerns.   Go to www.goodrx.com to look up your medications. This will give you a list of where you can find your prescriptions at the most affordable prices.

## 2011-11-02 NOTE — ED Provider Notes (Signed)
History     CSN: [id number]  Arrival date & time 11/02/11  2952   First MD Initiated Contact with Patient 11/02/11 1827      Chief Complaint  Patient presents with  . Ankle Injury    (Consider location/radiation/quality/duration/timing/severity/associated sxs/prior treatment) HPI Comments: Patient states that she was playing with children less than, rolled her left ankle outwards. She did not hear a "pop", but she states that her ankle feels unsteady. She has been able to put weight on it, states it is very painful. She reports pain, swelling, bruising along lateral aspect of her ankle. No numbness, tingling, weakness. No foot, leg, knee pain. She has never injured this ankle before.  ROS as noted in HPI. All other ROS negative.   Patient is a 31 y.o. female presenting with lower extremity injury. The history is provided by the patient. No language interpreter was used.  Ankle Injury This is a new problem. The current episode started yesterday. The problem occurs constantly. The problem has been gradually worsening. The symptoms are aggravated by standing and walking. Nothing relieves the symptoms. She has tried a cold compress for the symptoms. The treatment provided mild relief.    Past Medical History  Diagnosis Date  . Anxiety     Past Surgical History  Procedure Date  . Tubal ligation     History reviewed. No pertinent family history.  History  Substance Use Topics  . Smoking status: Current Everyday Smoker -- 0.2 packs/day for 10 years    Types: Cigarettes  . Smokeless tobacco: Not on file  . Alcohol Use: Yes    OB History    Grav Para Term Preterm Abortions TAB SAB Ect Mult Living                  Review of Systems  Constitutional: Negative for fever.  Musculoskeletal: Positive for joint swelling.  Skin: Positive for color change.  Neurological: Negative for numbness.    Allergies  Penicillins  Home Medications   Current Outpatient Rx  Name Route  Sig Dispense Refill  . ALPRAZOLAM 1 MG PO TABS Oral Take 0.5 tablets (0.5 mg total) by mouth 2 (two) times daily as needed for sleep. 60 tablet 2  . IBUPROFEN 600 MG PO TABS Oral Take 1 tablet (600 mg total) by mouth every 6 (six) hours as needed for pain. 30 tablet 0  . OXYCODONE-ACETAMINOPHEN 5-325 MG PO TABS Oral Take 1-2 tablets by mouth every 6 (six) hours as needed for pain. 20 tablet 0  . SERTRALINE HCL 50 MG PO TABS Oral Take 1 tablet (50 mg total) by mouth daily. 30 tablet 11    BP 113/75  Pulse 66  Temp 98.1 F (36.7 C) (Oral)  Resp 16  SpO2 100%  LMP 10/04/2011  Physical Exam  Nursing note and vitals reviewed. Constitutional: She is oriented to person, place, and time. She appears well-developed and well-nourished. No distress.  HENT:  Head: Normocephalic and atraumatic.  Eyes: Conjunctivae and EOM are normal.  Neck: Normal range of motion.  Cardiovascular: Normal rate.   Pulmonary/Chest: Effort normal.  Abdominal: She exhibits no distension.  Musculoskeletal:       Left ankle: She exhibits decreased range of motion, swelling and ecchymosis. tenderness. Lateral malleolus tenderness found. Achilles tendon normal.       Feet:       Lateral swelling, bruising inferior to lateral malleolus, see drawing.  Distal fibula tender, Medial malleolus NT,  Deltoid ligament NT, Lateral  ligaments tender, Achilles NT, Proximal fibula NT, Proximal 5th metatarsal NT, Midfoot NT, distal NVI with baseline sensation / motor to foot with CR<2 seconds.   Neurological: She is alert and oriented to person, place, and time.  Skin: Skin is warm and dry.  Psychiatric: She has a normal mood and affect. Her behavior is normal. Judgment and thought content normal.    ED Course  Procedures (including critical care time)  Labs Reviewed - No data to display Dg Ankle Complete Left  11/02/2011  *RADIOLOGY REPORT*  Clinical Data: Larey Seat and injured left ankle.  Lateral pain and swelling.  LEFT ANKLE  COMPLETE - 3+ VIEW  Comparison: None.  Findings: Diffuse soft tissue swelling, especially laterally.  No evidence of acute fracture or dislocation.  Ankle mortise intact with well-preserved joint space.  Small bone island in the lateral malleolus.  Well-preserved bone mineral density.  Small plantar calcaneal spur.  Large joint effusion/hemarthrosis.  IMPRESSION: No acute osseous abnormality.  Small plantar calcaneal spur.  Original Report Authenticated By: Arnell Sieving, M.D.     1. Left ankle sprain     MDM  X-ray reviewed by myself. No fracture. Full report per radiologist. Rest, ice, elevation, crutches WBAT, Percocet, this is a severe sprain. Discussed imaging, MDM, and plan for followup with Dr. Ranell Patrick, orthopedist on call, or [facility] within the next week.  Luiz Blare, MD 11/02/11 2029

## 2012-05-12 ENCOUNTER — Other Ambulatory Visit: Payer: Self-pay | Admitting: Family Medicine

## 2012-05-21 ENCOUNTER — Telehealth: Payer: Self-pay

## 2012-05-21 DIAGNOSIS — F329 Major depressive disorder, single episode, unspecified: Secondary | ICD-10-CM

## 2012-05-21 NOTE — Telephone Encounter (Signed)
Called in the Alprazolam, Dr Patsy Lager approved this on 05/12/12 but pharmacy did not get this. Please advise on the Zoloft refill pended this for her.

## 2012-05-21 NOTE — Telephone Encounter (Signed)
I have called patient to advise.  

## 2012-05-21 NOTE — Telephone Encounter (Signed)
Zoloft was rx'ed on 10/31/11 #30 with 11 refills, so she should still have enough at the pharmacy.

## 2012-05-21 NOTE — Telephone Encounter (Signed)
Patient says [facility] has contacted [place] several times to refill zoloft and xanax. Chart shows xanax refill date of 05/12/12 but patient has not picked anything up and [facility] doesn't have anything for patient (not in drawer either). She would like to refill both medications and is wondering why it's not being approved.  Best [phone]

## 2012-07-22 ENCOUNTER — Ambulatory Visit (INDEPENDENT_AMBULATORY_CARE_PROVIDER_SITE_OTHER): Payer: 59 | Admitting: Family Medicine

## 2012-07-22 VITALS — BP 112/78 | HR 80 | Temp 97.7°F | Resp 18 | Ht 64.0 in | Wt 156.4 lb

## 2012-07-22 DIAGNOSIS — H60399 Other infective otitis externa, unspecified ear: Secondary | ICD-10-CM

## 2012-07-22 DIAGNOSIS — I889 Nonspecific lymphadenitis, unspecified: Secondary | ICD-10-CM

## 2012-07-22 DIAGNOSIS — F411 Generalized anxiety disorder: Secondary | ICD-10-CM

## 2012-07-22 DIAGNOSIS — H6002 Abscess of left external ear: Secondary | ICD-10-CM

## 2012-07-22 MED ORDER — ALPRAZOLAM 1 MG PO TABS: 1.0000 mg | ORAL_TABLET | Freq: Every evening | ORAL | Status: DC | PRN

## 2012-07-22 MED ORDER — DOXYCYCLINE HYCLATE 100 MG PO TABS: 100.0000 mg | ORAL_TABLET | Freq: Two times a day (BID) | ORAL | Status: DC

## 2012-07-22 MED ORDER — ALPRAZOLAM 1 MG PO TABS: ORAL_TABLET | ORAL | Status: DC

## 2012-07-22 NOTE — Progress Notes (Signed)
Procedure Note: Verbal consent obtained.  Local anesthesia with 1 cc 2% lidocaine.  Betadine prep.  Incision with 11 blade.  Copious purulence expressed.  Wound culture collected.  Cleansed and dressed with dry dressing.  Discussed wound care.  No need for follow-up unless worsening or not improving.

## 2012-07-22 NOTE — Patient Instructions (Signed)
Take the doxycycline twice daily  Make an appointment to see Chelle PA back in a couple of months
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
## 2012-07-26 LAB — WOUND CULTURE

## 2013-08-25 ENCOUNTER — Encounter (HOSPITAL_COMMUNITY): Payer: Self-pay | Admitting: Emergency Medicine

## 2013-08-25 ENCOUNTER — Emergency Department (HOSPITAL_COMMUNITY)
Admission: EM | Admit: 2013-08-25 | Discharge: 2013-08-26 | Disposition: A | Discharge status: Home / Self Care | Payer: 59 | Attending: Emergency Medicine | Admitting: Emergency Medicine

## 2013-08-25 ENCOUNTER — Emergency Department (HOSPITAL_COMMUNITY): Payer: 59

## 2013-08-25 DIAGNOSIS — R071 Chest pain on breathing: Secondary | ICD-10-CM | POA: Insufficient documentation

## 2013-08-25 DIAGNOSIS — Z79899 Other long term (current) drug therapy: Secondary | ICD-10-CM | POA: Insufficient documentation

## 2013-08-25 DIAGNOSIS — F17200 Nicotine dependence, unspecified, uncomplicated: Secondary | ICD-10-CM | POA: Insufficient documentation

## 2013-08-25 DIAGNOSIS — R0789 Other chest pain: Secondary | ICD-10-CM

## 2013-08-25 DIAGNOSIS — Z880 Allergy status to penicillin: Secondary | ICD-10-CM | POA: Insufficient documentation

## 2013-08-25 DIAGNOSIS — Z9851 Tubal ligation status: Secondary | ICD-10-CM | POA: Insufficient documentation

## 2013-08-25 DIAGNOSIS — Z3202 Encounter for pregnancy test, result negative: Secondary | ICD-10-CM | POA: Insufficient documentation

## 2013-08-25 DIAGNOSIS — R52 Pain, unspecified: Secondary | ICD-10-CM | POA: Insufficient documentation

## 2013-08-25 DIAGNOSIS — F411 Generalized anxiety disorder: Secondary | ICD-10-CM | POA: Insufficient documentation

## 2013-08-25 DIAGNOSIS — N390 Urinary tract infection, site not specified: Secondary | ICD-10-CM | POA: Insufficient documentation

## 2013-08-25 LAB — COMPREHENSIVE METABOLIC PANEL
ALT: 16 U/L (ref 0–35)
AST: 18 U/L (ref 0–37)
Albumin: 4.6 g/dL (ref 3.5–5.2)
Alkaline Phosphatase: 40 U/L (ref 39–117)
BUN: 14 mg/dL (ref 6–23)
CALCIUM: 9.6 mg/dL (ref 8.4–10.5)
CO2: 20 mEq/L (ref 19–32)
CREATININE: 0.78 mg/dL (ref 0.50–1.10)
Chloride: 102 mEq/L (ref 96–112)
GFR calc non Af Amer: >90 mL/min (ref >90)
GLUCOSE: 85 mg/dL (ref 70–99)
Potassium: 4.1 mEq/L (ref 3.7–5.3)
SODIUM: 138 meq/L (ref 137–147)
TOTAL PROTEIN: 7.4 g/dL (ref 6.0–8.3)
Total Bilirubin: 0.5 mg/dL (ref 0.3–1.2)

## 2013-08-25 LAB — CBC WITH DIFFERENTIAL/PLATELET
BASOS ABS: 0 10*3/uL (ref 0.0–0.1)
Basophils Relative: 0 % (ref 0–1)
EOS ABS: 0.3 10*3/uL (ref 0.0–0.7)
EOS PCT: 3 % (ref 0–5)
HCT: 39.3 % (ref 36.0–46.0)
Hemoglobin: 13.9 g/dL (ref 12.0–15.0)
LYMPHS ABS: 3 10*3/uL (ref 0.7–4.0)
Lymphocytes Relative: 28 % (ref 12–46)
MCH: 32.9 pg (ref 26.0–34.0)
MCHC: 35.4 g/dL (ref 30.0–36.0)
MCV: 92.9 fL (ref 78.0–100.0)
Monocytes Absolute: 0.7 10*3/uL (ref 0.1–1.0)
Monocytes Relative: 7 % (ref 3–12)
Neutro Abs: 6.5 10*3/uL (ref 1.7–7.7)
Neutrophils Relative %: 62 % (ref 43–77)
PLATELETS: 301 10*3/uL (ref 150–400)
RBC: 4.23 MIL/uL (ref 3.87–5.11)
RDW: 13.1 % (ref 11.5–15.5)
WBC: 10.5 10*3/uL (ref 4.0–10.5)

## 2013-08-25 LAB — TROPONIN I: Troponin I: <0.3 ng/mL (ref <0.30)

## 2013-08-25 MED ORDER — MORPHINE SULFATE 4 MG/ML IJ SOLN
4.0000 mg | Freq: Once | INTRAMUSCULAR | Status: AC
Start: 2013-08-25 — End: 2013-08-25
  Administered 2013-08-25: 4 mg via INTRAVENOUS
  Filled 2013-08-25: qty 1

## 2013-08-25 MED ORDER — OXYCODONE-ACETAMINOPHEN 5-325 MG PO TABS
1.0000 | ORAL_TABLET | Freq: Once | ORAL | Status: AC
  Administered 2013-08-25: 1 via ORAL
  Filled 2013-08-25: qty 1

## 2013-08-25 NOTE — ED Notes (Signed)
Anderson Malta, PA-C at the bedside.

## 2013-08-25 NOTE — ED Provider Notes (Signed)
CSN: [id number]     Arrival date & time 08/25/13  2101 History   First MD Initiated Contact with Patient 08/25/13 2232     Chief Complaint  Patient presents with  . Abdominal Pain     (Consider location/radiation/quality/duration/timing/severity/associated sxs/prior Treatment) HPI Comments: Patient is a 33 year old female past medical history significant for anxiety, tobacco use presented to the emergency department for acute onset lateral lower rib pain without radiation. She states the pain is sharp and stabbing without radiation. She states it is worsened with movement and deep inspiration. She states her pain is improved if she applies pressure to the area. She denies any injuries to the area prior to onset of pain. She denies any shortness of breath, nausea, vomiting, fevers, cough, breast pain or nipple discharge. She denies a history of this pain in the past. Abdominal surgical history includes tubal ligation. PERC negative.   Patient is a 33 y.o. female presenting with abdominal pain.  Abdominal Pain Associated symptoms: chest pain   Associated symptoms: no chills, no cough, no diarrhea, no fever, no nausea, no shortness of breath and no vomiting     Past Medical History  Diagnosis Date  . Anxiety    Past Surgical History  Procedure Laterality Date  . Tubal ligation     No family history on file. History  Substance Use Topics  . Smoking status: Current Every Day Smoker -- 0.20 packs/day for 10 years    Types: Cigarettes  . Smokeless tobacco: Not on file  . Alcohol Use: Yes   OB History   Grav Para Term Preterm Abortions TAB SAB Ect Mult Living                 Review of Systems  Constitutional: Negative for fever and chills.  Respiratory: Negative for cough and shortness of breath.   Cardiovascular: Positive for chest pain. Negative for leg swelling.  Gastrointestinal: Negative for nausea, vomiting, abdominal pain and diarrhea.  All other systems reviewed and are  negative.     Allergies  Penicillins  Home Medications   Prior to Admission medications   Medication Sig Start Date End Date Taking? Authorizing Provider  diphenhydrAMINE (BENADRYL) 25 MG tablet Take 25 mg by mouth every 6 (six) hours as needed for itching or allergies.   Yes Historical Provider, MD  loratadine (CLARITIN) 10 MG tablet Take 10 mg by mouth daily.   Yes Historical Provider, MD  miconazole (MONISTAT 1 COMBINATION PACK) kit Place 1 each vaginally daily as needed (for yeast infection).   Yes Historical Provider, MD   BP 117/80  Pulse 64  Temp(Src) 97.5 F (36.4 C) (Oral)  Resp 14  SpO2 98%  LMP 07/31/2013 Physical Exam  Constitutional: She is oriented to person, place, and time. She appears well-developed and well-nourished. No distress.  HENT:  Head: Normocephalic and atraumatic.  Right Ear: External ear normal.  Left Ear: External ear normal.  Nose: Nose normal.  Mouth/Throat: No oropharyngeal exudate.  Eyes: Conjunctivae are normal.  Neck: Neck supple.  Cardiovascular: Normal rate, regular rhythm, normal heart sounds and intact distal pulses.   Pulmonary/Chest: Effort normal and breath sounds normal. No accessory muscle usage. No respiratory distress. She exhibits tenderness. She exhibits no edema, no deformity and no swelling.    Abdominal: Soft. Bowel sounds are normal. There is no tenderness. There is no rigidity, no rebound and no guarding.  Musculoskeletal: Normal range of motion. She exhibits no edema.  Neurological: She is alert and oriented  to person, place, and time.  Skin: Skin is warm and dry. No rash noted. She is not diaphoretic.  Psychiatric: Her mood appears anxious.    ED Course  Procedures (including critical care time) Medications  oxyCODONE-acetaminophen (PERCOCET/ROXICET) 5-325 MG per tablet 1 tablet (1 tablet Oral Given 08/25/13 2119)  oxyCODONE-acetaminophen (PERCOCET/ROXICET) 5-325 MG per tablet 1 tablet (1 tablet Oral Given 08/25/13  2252)  morphine 4 MG/ML injection 4 mg (4 mg Intravenous Given 08/25/13 2312)  HYDROmorphone (DILAUDID) injection 1 mg (1 mg Intravenous Given 08/26/13 0027)    Labs Review Labs Reviewed  URINALYSIS, ROUTINE W REFLEX MICROSCOPIC - Abnormal; Notable for the following:    APPearance TURBID (*)    Ketones, ur 15 (*)    Leukocytes, UA MODERATE (*)    All other components within normal limits  URINE MICROSCOPIC-ADD ON - Abnormal; Notable for the following:    Squamous Epithelial / LPF MANY (*)    Bacteria, UA MANY (*)    All other components within normal limits  CBC WITH DIFFERENTIAL  COMPREHENSIVE METABOLIC PANEL  TROPONIN I  PREGNANCY, URINE    Imaging Review Dg Ribs Unilateral W/chest Left  08/25/2013   CLINICAL DATA:  Sudden onset left axillary rib pain  EXAM: LEFT RIBS AND CHEST - 3+ VIEW  COMPARISON:  None.  FINDINGS: No fracture or other bone lesions are seen involving the ribs. There is no evidence of pneumothorax or pleural effusion. Both lungs are clear. Heart size and mediastinal contours are within normal limits.  IMPRESSION: Negative.   Electronically Signed   By: Kathreen Devoid   On: 08/25/2013 23:30     EKG Interpretation None      MDM   Final diagnoses:  Left-sided chest wall pain  UTI (urinary tract infection)    Filed Vitals:   08/26/13 0130  BP: 117/80  Pulse: 64  Temp:   Resp:    Afebrile, NAD, non-toxic appearing, AAOx4.    1) Chest wall pain: Patient is to be discharged with recommendation to follow up with PCP in regards to today's hospital visit. Chest pain is likely chest wall pain as left lower lateral ribs are TTP w/o deformity or swelling, but not likely of cardiac or pulmonary etiology d/t presentation, perc negative, VSS, no tracheal deviation, no JVD or new murmur, RRR, breath sounds equal bilaterally, EKG without acute abnormalities, negative troponin, and negative CXR with unilateral ribs.   2) UTI: Pt has been diagnosed with a UTI. Pt is  afebrile, no CVA tenderness, normotensive, and denies N/V. Pt to be dc home with antibiotics and instructions to follow up with PCP if symptoms persist.  Pt has been advised to return to the ED is CP becomes exertional, associated with diaphoresis or nausea, radiates to left jaw/arm, worsens or becomes concerning in any way. Pt appears reliable for follow up and is agreeable to discharge.    Harlow Mares, PA-C 08/26/13 [phone]

## 2013-08-25 NOTE — ED Notes (Signed)
Patient is back from X-ray.

## 2013-08-25 NOTE — ED Notes (Signed)
Discussed need void for urine sample.  Patient acknowledges, and prepares for X-ray.

## 2013-08-25 NOTE — ED Notes (Signed)
Pt. reports left upper / lateral abdominal pain onset this evening worse with movement , denies injury / respirations unlabored. No nausea/vomitting or diarrhea.

## 2013-08-26 LAB — URINALYSIS, ROUTINE W REFLEX MICROSCOPIC
Bilirubin Urine: NEGATIVE
Glucose, UA: NEGATIVE mg/dL
HGB URINE DIPSTICK: NEGATIVE
Ketones, ur: 15 mg/dL — AB
NITRITE: NEGATIVE
Protein, ur: NEGATIVE mg/dL
SPECIFIC GRAVITY, URINE: 1.018 (ref 1.005–1.030)
UROBILINOGEN UA: 0.2 mg/dL (ref 0.0–1.0)
pH: 5.5 (ref 5.0–8.0)

## 2013-08-26 LAB — URINE MICROSCOPIC-ADD ON

## 2013-08-26 LAB — PREGNANCY, URINE: PREG TEST UR: NEGATIVE

## 2013-08-26 MED ORDER — IBUPROFEN 800 MG PO TABS: 800.0000 mg | ORAL_TABLET | Freq: Three times a day (TID) | ORAL | Status: DC

## 2013-08-26 MED ORDER — HYDROMORPHONE HCL PF 1 MG/ML IJ SOLN
1.0000 mg | Freq: Once | INTRAMUSCULAR | Status: AC
  Administered 2013-08-26: 1 mg via INTRAVENOUS
  Filled 2013-08-26: qty 1

## 2013-08-26 MED ORDER — OXYCODONE-ACETAMINOPHEN 5-325 MG PO TABS: 1.0000 | ORAL_TABLET | ORAL | Status: DC | PRN

## 2013-08-26 MED ORDER — CIPROFLOXACIN HCL 500 MG PO TABS: 500.0000 mg | ORAL_TABLET | Freq: Two times a day (BID) | ORAL | Status: DC

## 2013-08-26 NOTE — ED Notes (Signed)
Jen, PA-C, at the bedside.  

## 2013-08-26 NOTE — Discharge Instructions (Signed)
Please follow up with your primary care physician in 1-2 days. If you do not have one please call the [place] number listed above. Please take your antibiotic until completion. Please take pain medication and/or muscle relaxants as prescribed and as needed for pain. Please do not drive on narcotic pain medication or on muscle relaxants. Please read all discharge instructions and return precautions.    Chest Wall Pain Chest wall pain is pain in or around the bones and muscles of your chest. It may take up to 6 weeks to get better. It may take longer if you must stay physically active in your work and activities.  CAUSES  Chest wall pain may happen on its own. However, it may be caused by:  A viral illness like the flu.  Injury.  Coughing.  Exercise.  Arthritis.  Fibromyalgia.  Shingles. HOME CARE INSTRUCTIONS   Avoid overtiring physical activity. Try not to strain or perform activities that cause pain. This includes any activities using your chest or your abdominal and side muscles, especially if heavy weights are used.  Put ice on the sore area.  Put ice in a plastic bag.  Place a towel between your skin and the bag.  Leave the ice on for 15-20 minutes per hour while awake for the first 2 days.  Only take over-the-counter or prescription medicines for pain, discomfort, or fever as directed by your caregiver. SEEK IMMEDIATE MEDICAL CARE IF:   Your pain increases, or you are very uncomfortable.  You have a fever.  Your chest pain becomes worse.  You have new, unexplained symptoms.  You have nausea or vomiting.  You feel sweaty or lightheaded.  You have a cough with phlegm (sputum), or you cough up blood. MAKE SURE YOU:   Understand these instructions.  Will watch your condition.  Will get help right away if you are not doing well or get worse. Document Released: 04/30/2005 Document Revised: 07/23/2011 Document Reviewed:  12/25/2010 [facility] Patient Information 2014 [place], [place]. Urinary Tract Infection Urinary tract infections (UTIs) can develop anywhere along your urinary tract. Your urinary tract is your body's drainage system for removing wastes and extra water. Your urinary tract includes two kidneys, two ureters, a bladder, and a urethra. Your kidneys are a pair of bean-shaped organs. Each kidney is about the size of your fist. They are located below your ribs, one on each side of your spine. CAUSES Infections are caused by microbes, which are microscopic organisms, including fungi, viruses, and bacteria. These organisms are so small that they can only be seen through a microscope. Bacteria are the microbes that most commonly cause UTIs. SYMPTOMS  Symptoms of UTIs may vary by age and gender of the patient and by the location of the infection. Symptoms in young women typically include a frequent and intense urge to urinate and a painful, burning feeling in the bladder or urethra during urination. Older women and men are more likely to be tired, shaky, and weak and have muscle aches and abdominal pain. A fever may mean the infection is in your kidneys. Other symptoms of a kidney infection include pain in your back or sides below the ribs, nausea, and vomiting. DIAGNOSIS To diagnose a UTI, your caregiver will ask you about your symptoms. Your caregiver also will ask to provide a urine sample. The urine sample will be tested for bacteria and white blood cells. White blood cells are made by your body to help fight infection. TREATMENT  Typically, UTIs can be treated with medication. Because most UTIs are caused by a bacterial infection, they usually can be treated with the use of antibiotics. The choice of antibiotic and length of treatment depend on your symptoms and the type of bacteria causing your infection. HOME CARE INSTRUCTIONS  If you were prescribed antibiotics, take them exactly as your caregiver instructs  you. Finish the medication even if you feel better after you have only taken some of the medication.  Drink enough water and fluids to keep your urine clear or pale yellow.  Avoid caffeine, tea, and carbonated beverages. They tend to irritate your bladder.  Empty your bladder often. Avoid holding urine for long periods of time.  Empty your bladder before and after sexual intercourse.  After a bowel movement, women should cleanse from front to back. Use each tissue only once. SEEK MEDICAL CARE IF:   You have back pain.  You develop a fever.  Your symptoms do not begin to resolve within 3 days. SEEK IMMEDIATE MEDICAL CARE IF:   You have severe back pain or lower abdominal pain.  You develop chills.  You have nausea or vomiting.  You have continued burning or discomfort with urination. MAKE SURE YOU:   Understand these instructions.  Will watch your condition.  Will get help right away if you are not doing well or get worse. Document Released: 02/07/2005 Document Revised: 10/30/2011 Document Reviewed: 06/08/2011 [facility] Patient Information 2014 [place].

## 2013-08-26 NOTE — ED Provider Notes (Signed)
Medical screening examination/treatment/procedure(s) were performed by non-physician practitioner and as supervising physician I was immediately available for consultation/collaboration.   EKG Interpretation None        Blanchie Dessert, MD 08/26/13 1010

## 2013-10-24 ENCOUNTER — Other Ambulatory Visit: Payer: Self-pay | Admitting: Family Medicine

## 2013-10-24 ENCOUNTER — Ambulatory Visit (HOSPITAL_COMMUNITY)
Admission: RE | Admit: 2013-10-24 | Discharge: 2013-10-24 | Disposition: A | Discharge status: Home / Self Care | Payer: 59 | Source: Ambulatory Visit | Attending: Family Medicine | Admitting: Family Medicine

## 2013-10-24 ENCOUNTER — Ambulatory Visit (INDEPENDENT_AMBULATORY_CARE_PROVIDER_SITE_OTHER): Payer: 59 | Admitting: Family Medicine

## 2013-10-24 VITALS — BP 128/74 | HR 80 | Temp 98.8°F | Resp 17 | Ht 66.0 in | Wt 177.2 lb

## 2013-10-24 DIAGNOSIS — R1012 Left upper quadrant pain: Secondary | ICD-10-CM

## 2013-10-24 DIAGNOSIS — F411 Generalized anxiety disorder: Secondary | ICD-10-CM

## 2013-10-24 DIAGNOSIS — K8020 Calculus of gallbladder without cholecystitis without obstruction: Secondary | ICD-10-CM | POA: Insufficient documentation

## 2013-10-24 DIAGNOSIS — R112 Nausea with vomiting, unspecified: Secondary | ICD-10-CM

## 2013-10-24 DIAGNOSIS — M549 Dorsalgia, unspecified: Secondary | ICD-10-CM

## 2013-10-24 LAB — POCT URINALYSIS DIPSTICK
Bilirubin, UA: NEGATIVE
Blood, UA: NEGATIVE
GLUCOSE UA: NEGATIVE
Ketones, UA: NEGATIVE
Leukocytes, UA: NEGATIVE
NITRITE UA: NEGATIVE
Protein, UA: NEGATIVE
SPEC GRAV UA: 1.015
Urobilinogen, UA: 0.2
pH, UA: 6

## 2013-10-24 LAB — COMPREHENSIVE METABOLIC PANEL
ALT: 17 U/L (ref 0–35)
AST: 16 U/L (ref 0–37)
Albumin: 5.1 g/dL (ref 3.5–5.2)
Alkaline Phosphatase: 40 U/L (ref 39–117)
BILIRUBIN TOTAL: 0.7 mg/dL (ref 0.2–1.2)
BUN: 17 mg/dL (ref 6–23)
CALCIUM: 9.7 mg/dL (ref 8.4–10.5)
CHLORIDE: 102 meq/L (ref 96–112)
CO2: 20 mEq/L (ref 19–32)
Creat: 0.79 mg/dL (ref 0.50–1.10)
Glucose, Bld: 86 mg/dL (ref 70–99)
Potassium: 4.3 mEq/L (ref 3.5–5.3)
SODIUM: 133 meq/L — AB (ref 135–145)
TOTAL PROTEIN: 7.7 g/dL (ref 6.0–8.3)

## 2013-10-24 LAB — POCT UA - MICROSCOPIC ONLY
CASTS, UR, LPF, POC: NEGATIVE
Crystals, Ur, HPF, POC: NEGATIVE
Mucus, UA: POSITIVE
Yeast, UA: NEGATIVE

## 2013-10-24 LAB — POCT CBC
Granulocyte percent: 72.9 %G (ref 37–80)
HCT, POC: 45.3 % (ref 37.7–47.9)
Hemoglobin: 15.3 g/dL (ref 12.2–16.2)
Lymph, poc: 2.3 (ref 0.6–3.4)
MCH, POC: 32.6 pg — AB (ref 27–31.2)
MCHC: 33.8 g/dL (ref 31.8–35.4)
MCV: 96.4 fL (ref 80–97)
MID (CBC): 0.5 (ref 0–0.9)
MPV: 10.1 fL (ref 0–99.8)
PLATELET COUNT, POC: 293 10*3/uL (ref 142–424)
POC GRANULOCYTE: 8 — AB (ref 2–6.9)
POC LYMPH %: 21.2 % (ref 10–50)
POC MID %: 5.9 % (ref 0–12)
RBC: 4.7 M/uL (ref 4.04–5.48)
RDW, POC: 13.6 %
WBC: 11 10*3/uL — AB (ref 4.6–10.2)

## 2013-10-24 LAB — LIPASE: LIPASE: 22 U/L (ref 0–75)

## 2013-10-24 LAB — AMYLASE: Amylase: 37 U/L (ref 0–105)

## 2013-10-24 LAB — POCT URINE PREGNANCY: PREG TEST UR: NEGATIVE

## 2013-10-24 MED ORDER — SUCRALFATE 1 G PO TABS: 1.0000 g | ORAL_TABLET | Freq: Three times a day (TID) | ORAL | Status: DC

## 2013-10-24 MED ORDER — ALPRAZOLAM 1 MG PO TABS: ORAL_TABLET | ORAL | Status: DC

## 2013-10-24 MED ORDER — METHOCARBAMOL 500 MG PO TABS: 500.0000 mg | ORAL_TABLET | Freq: Three times a day (TID) | ORAL | Status: DC | PRN

## 2013-10-24 NOTE — Patient Instructions (Signed)
I will let you know as soon as I get your ultrasound results back later on today.   Go to [facility] and register in Radiology for your ultrasound. Do not eat or drink anything!  Assuming that all looks ok on your ultrasound we are going to try treating your for gastritis.  Please purchase a box of OTC prilosec and take one a day.  Also take a carafate before meals and before bed for the next 7- 10 days.  I will be in touch with the rest of your labs.   Use the robaxin as needed for back pain- this will also make you feel a bit sleepy.  Do not combine this with xanax.  You can use xanax sparingly for anxiety emergencies

## 2013-10-24 NOTE — Progress Notes (Signed)
[facility] [address], [place] [address] [phone]  Date:  10/24/2013   Name:  Taylor Huff   DOB:  Apr 16, 1981   MRN:  [id number]  PCP:  No PCP Per Patient    Chief Complaint: Flank Pain, Dizziness and Nausea   History of Present Illness:  Taylor Huff is a 33 y.o. very pleasant female patient who presents with the following:  About two months ago she developed pain in her left side under her ribs.  She was seen at the ED and had x-rays, was treated for a UTI with cipro.  However this did not get rid of the pain in her side.  It got better for a little while with the cipro but returned She notes that her side pain is worse if she stands or walks for a while.  Sitting and resting makes this better.   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  Her job is somewhat stressful but otherwise she does not note any severe stress or changes in her life.   Last night and Monday night she had more severe pain. She also notes pain in her left back which she has noted "always," but more recently this is worse. It feels as though her muscles are in spasm. No SOB.   She has not noted any urinary sx- no hematuria or dysuria.  No vaginal sx.  She does not have a fever.  She feels nauseated, and "like I could throw up on cue."  She has been vomiting on occasion over the last week- she will "first feel real hot, then lightheaded, then will get sick."  She has not had diarrhea.  She is still able to eat.    She has been generally healthy.  She has had some anxiety, and has been treated with xanax for "emergencies" but is currently out of this.  Also was on sertraline a while ago, not now. She does not really want to use an SSRI right now as she thinks her anxiety is more due to her pain and hopes this will soon be resolved    She had a small amount of water about 3 hours ago  - otherwise she is NPO today There are no active problems to display for this patient.   Past  Medical History  Diagnosis Date  . Anxiety     Past Surgical History  Procedure Laterality Date  . Tubal ligation      History  Substance Use Topics  . Smoking status: Current Every Day Smoker -- 0.20 packs/day for 10 years    Types: Cigarettes  . Smokeless tobacco: Not on file  . Alcohol Use: Yes    No family history on file.  Allergies  Allergen Reactions  . Penicillins Swelling    Medication list has been reviewed and updated.  Current Outpatient Prescriptions on File Prior to Visit  Medication Sig Dispense Refill  . ciprofloxacin (CIPRO) 500 MG tablet Take 1 tablet (500 mg total) by mouth 2 (two) times daily.  6 tablet  0  . diphenhydrAMINE (BENADRYL) 25 MG tablet Take 25 mg by mouth every 6 (six) hours as needed for itching or allergies.      Marland Kitchen ibuprofen (ADVIL,MOTRIN) 800 MG tablet Take 1 tablet (800 mg total) by mouth 3 (three) times daily.  21 tablet  0  . loratadine (CLARITIN) 10 MG tablet Take 10 mg by mouth daily.      Marland Kitchen  miconazole (MONISTAT 1 COMBINATION PACK) kit Place 1 each vaginally daily as needed (for yeast infection).      Marland Kitchen oxyCODONE-acetaminophen (PERCOCET) 5-325 MG per tablet Take 1 tablet by mouth every 4 (four) hours as needed.  20 tablet  0   No current facility-administered medications on file prior to visit.    Review of Systems:  As per HPI- otherwise negative.   Physical Examination: Filed Vitals:   10/24/13 0910  BP: 128/74  Pulse: 80  Temp: 98.8 F (37.1 C)  Resp: 17   Filed Vitals:   10/24/13 0910  Height: 5' 6" (1.676 m)  Weight: 177 lb 3.2 oz (80.377 kg)   Body mass index is 28.61 kg/(m^2). Ideal Body Weight: Weight in (lb) to have BMI = 25: 154.6  GEN: WDWN, NAD, Non-toxic, A & O x 3, overweight.  Appears well but is stressed and tearful HEENT: Atraumatic, Normocephalic. Neck supple. No masses, No LAD.  Bilateral TM wnl, oropharynx normal.  PEERL,EOMI.   Ears and Nose: No external deformity. CV: RRR, No M/G/R. No JVD.  No thrill. No extra heart sounds. PULM: CTA B, no wheezes, crackles, rhonchi. No retractions. No resp. distress. No accessory muscle use. ABD: S, ND, +BS. No rebound. No HSM.  Tender across the upper abdomen, more in the epigastric and LUQ but also some in the RUQ.  No lower quadrant pain.  Negative murphy's  EXTR: No c/c/e NEURO Normal gait.  PSYCH: Normally interactive. Conversant. Not depressed or anxious appearing.  Calm demeanor.  She is tender and in spasm over the muscles of the thoracic back, more on the left  Assessment and Plan: Abdominal pain, left upper quadrant - Plan: POCT CBC, POCT UA - Microscopic Only, POCT urinalysis dipstick, POCT urine pregnancy, Urine culture, Comprehensive metabolic panel, Amylase, Lipase, sucralfate (CARAFATE) 1 G tablet, US Abdomen Complete  Nausea with vomiting - Plan: POCT urine pregnancy, US Abdomen Complete  Back pain - Plan: methocarbamol (ROBAXIN) 500 MG tablet  GAD (generalized anxiety disorder) - Plan: ALPRAZolam (XANAX) 1 MG tablet  Ciarra is here today with persistent pain in her abdomen and now also pain and muscle spasm in her back.  She is also stressed and notes sx of anxiety and panic.  Will send for an ultrasound to evaluate her abdominal pain- although her pain is more in the LUQ she does have some RUQ sx as well so gallbladder colic is a consideration. Also suspect an element of GERD/ gastritis so will use carafate and an OTC PPI.  For her back pain we will try robaxin as needed.  For anxiety she may use xanax prn- however if her sx continue longer term once we have found the source of her pain may need to go back on an SSRI or similar Await ultrasound results, she will go now  Signed Lamar Blinks, MD  ULTRASOUND ABDOMEN COMPLETE COMPARISON: None. FINDINGS: Gallbladder:  2.2 cm gallstone within the gallbladder, mobile. No wall thickening. Negative sonographic Murphy's. Common bile duct: Diameter: Normal caliber, 3  mm. Liver:No focal lesion identified. Within normal limits in parenchymal echogenicity. IVC:No abnormality visualized. Pancreas:Visualized portion unremarkable. Spleen:Size and appearance within normal limits. Right Kidney:Length: 10.8 cm. Echogenicity within normal limits. No mass or hydronephrosis visualized. Left Kidney:Length: 10.2 cm. Echogenicity within normal limits. No mass or hydronephrosis visualized. Abdominal aorta:No aneurysm visualized. Other findings:None.  IMPRESSION: Cholelithiasis with 2.2 cm solitary mobile gallstone. No evidence of acute cholecystitis.  Called her to go over her ultrasound- she does have  a gallstone but no evidence of acute cholecystitis.  Will refer her to general surgery next week.  For the time being continue with plan as per pt instructions.  If any worsening she will seek care Meds ordered this encounter  Medications  . sucralfate (CARAFATE) 1 G tablet    Sig: Take 1 tablet (1 g total) by mouth 4 (four) times daily -  with meals and at bedtime.    Dispense:  40 tablet    Refill:  0  . methocarbamol (ROBAXIN) 500 MG tablet    Sig: Take 1 tablet (500 mg total) by mouth every 8 (eight) hours as needed for muscle spasms.    Dispense:  30 tablet    Refill:  0  . ALPRAZolam (XANAX) 1 MG tablet    Sig: Take 1/2 up to twice a day as needed for anxiety and insomnia    Dispense:  30 tablet    Refill:  0    Results for orders placed in visit on 10/24/13  COMPREHENSIVE METABOLIC PANEL      Result Value Ref Range   Sodium 133 (*) 135 - 145 mEq/L   Potassium 4.3  3.5 - 5.3 mEq/L   Chloride 102  96 - 112 mEq/L   CO2 20  19 - 32 mEq/L   Glucose, Bld 86  70 - 99 mg/dL   BUN 17  6 - 23 mg/dL   Creat 0.79  0.50 - 1.10 mg/dL   Total Bilirubin 0.7  0.2 - 1.2 mg/dL   Alkaline Phosphatase 40  39 - 117 U/L   AST 16  0 - 37 U/L   ALT 17  0 - 35 U/L   Total Protein 7.7  6.0 - 8.3 g/dL   Albumin 5.1  3.5 - 5.2 g/dL   Calcium 9.7  8.4 - 10.5 mg/dL   AMYLASE      Result Value Ref Range   Amylase 37  0 - 105 U/L  LIPASE      Result Value Ref Range   Lipase 22  0 - 75 U/L  POCT CBC      Result Value Ref Range   WBC 11.0 (*) 4.6 - 10.2 K/uL   Lymph, poc 2.3  0.6 - 3.4   POC LYMPH PERCENT 21.2  10 - 50 %L   MID (cbc) 0.5  0 - 0.9   POC MID % 5.9  0 - 12 %M   POC Granulocyte 8.0 (*) 2 - 6.9   Granulocyte percent 72.9  37 - 80 %G   RBC 4.70  4.04 - 5.48 M/uL   Hemoglobin 15.3  12.2 - 16.2 g/dL   HCT, POC 45.3  37.7 - 47.9 %   MCV 96.4  80 - 97 fL   MCH, POC 32.6 (*) 27 - 31.2 pg   MCHC 33.8  31.8 - 35.4 g/dL   RDW, POC 13.6     Platelet Count, POC 293  142 - 424 K/uL   MPV 10.1  0 - 99.8 fL  POCT UA - MICROSCOPIC ONLY      Result Value Ref Range   WBC, Ur, HPF, POC 4-5     RBC, urine, microscopic 2-3     Bacteria, U Microscopic 1+     Mucus, UA positive     Epithelial cells, urine per micros 5-10     Crystals, Ur, HPF, POC neg     Casts, Ur, LPF, POC neg     Yeast,  UA neg     Sperm      POCT URINALYSIS DIPSTICK      Result Value Ref Range   Color, UA dark yellow     Clarity, UA cloudy     Glucose, UA neg     Bilirubin, UA neg     Ketones, UA neg     Spec Grav, UA 1.015     Blood, UA neg     pH, UA 6.0     Protein, UA neg     Urobilinogen, UA 0.2     Nitrite, UA neg     Leukocytes, UA Negative    POCT URINE PREGNANCY      Result Value Ref Range   Preg Test, Ur Negative

## 2013-10-25 LAB — URINE CULTURE

## 2013-10-26 ENCOUNTER — Inpatient Hospital Stay (HOSPITAL_COMMUNITY): Admission: RE | Admit: 2013-10-26 | Payer: 59 | Source: Ambulatory Visit

## 2013-10-26 ENCOUNTER — Telehealth: Payer: Self-pay

## 2013-10-26 NOTE — Telephone Encounter (Signed)
Pt needs to talk with dr copland , she recevied a call from [place] but they can not see her until mid July and would like to know what to do next   Best number [phone]

## 2013-10-26 NOTE — Telephone Encounter (Signed)
Called her back and LMOM- I will call and see if I can get this appt moved up

## 2013-10-27 NOTE — Telephone Encounter (Signed)
Taylor Huff called and was able to move this appt to later this month.  Taylor Huff is ok with this plan

## 2013-11-05 ENCOUNTER — Other Ambulatory Visit: Payer: Self-pay | Admitting: Family Medicine

## 2013-11-08 ENCOUNTER — Other Ambulatory Visit: Payer: Self-pay | Admitting: Family Medicine

## 2013-11-08 ENCOUNTER — Telehealth: Payer: Self-pay

## 2013-11-08 DIAGNOSIS — F411 Generalized anxiety disorder: Secondary | ICD-10-CM

## 2013-11-08 NOTE — Telephone Encounter (Signed)
Patient states she contacted her pharmacy for a refill on her xanax. Patient states she dropped the medication in her sink that was full of water. Please return call and advise. Thank you.

## 2013-11-09 ENCOUNTER — Ambulatory Visit (INDEPENDENT_AMBULATORY_CARE_PROVIDER_SITE_OTHER): Payer: 59 | Admitting: General Surgery

## 2013-11-11 ENCOUNTER — Encounter (INDEPENDENT_AMBULATORY_CARE_PROVIDER_SITE_OTHER): Payer: Self-pay

## 2013-11-11 ENCOUNTER — Encounter (INDEPENDENT_AMBULATORY_CARE_PROVIDER_SITE_OTHER): Payer: Self-pay | Admitting: General Surgery

## 2013-11-11 ENCOUNTER — Ambulatory Visit (INDEPENDENT_AMBULATORY_CARE_PROVIDER_SITE_OTHER): Payer: 59 | Admitting: General Surgery

## 2013-11-11 ENCOUNTER — Encounter (HOSPITAL_COMMUNITY): Payer: Self-pay | Admitting: *Deleted

## 2013-11-11 VITALS — BP 100/80 | HR 68 | Temp 98.0°F | Resp 14 | Ht 67.0 in | Wt 180.4 lb

## 2013-11-11 DIAGNOSIS — K8010 Calculus of gallbladder with chronic cholecystitis without obstruction: Secondary | ICD-10-CM

## 2013-11-11 NOTE — Progress Notes (Signed)
Chief Complaint  Taylor Huff presents with  . Cholelithiasis    HISTORY:  The Taylor Huff is a 33 year old female who is referred by Dr. Silvestre Mesi for consultation regarding her cholelithiasis.  She has had significant abdominal pain that has sent her to the ER within the past month. She is also having postprandial nausea and vomiting. She went to the emergency department and gotten some rib films. These were negative. She also had a dirty urine and was diagnosed with a UTI. She continued to have discomfort and more of this seemed to migrate to the epigastric location and the right upper quadrant. When she saw the Dr. Edilia Bo at urgent care, she was noted to have right upper quadrant tenderness and an ultrasound was requested. This demonstrated gallstones.  The post prandial nausea has gotten worse and more frequent.    Past Medical History  Diagnosis Date  . Anxiety     Past Surgical History  Procedure Laterality Date  . Tubal ligation    . Hemmorhoids      Current Outpatient Prescriptions  Medication Sig Dispense Refill  . ALPRAZolam (XANAX) 1 MG tablet TAKE 1/2 TABLET UP TO TWICE A DAY AS NEEDED FOR ANXIETY AND INSOMNIA  30 tablet  0  . ibuprofen (ADVIL,MOTRIN) 800 MG tablet Take 1 tablet (800 mg total) by mouth 3 (three) times daily.  21 tablet  0  . loratadine (CLARITIN) 10 MG tablet Take 10 mg by mouth daily.      . methocarbamol (ROBAXIN) 500 MG tablet Take 1 tablet (500 mg total) by mouth every 8 (eight) hours as needed for muscle spasms.  30 tablet  0  . sucralfate (CARAFATE) 1 G tablet Take 1 tablet (1 g total) by mouth 4 (four) times daily -  with meals and at bedtime.  40 tablet  0   No current facility-administered medications for this visit.     Allergies  Allergen Reactions  . Penicillins Swelling     Family History  Problem Relation Age of Onset  . Hypertension Mother      History   Social History  . Marital Status: Single    Spouse Name: N/A    Number  of Children: N/A  . Years of Education: N/A   Social History Main Topics  . Smoking status: Current Every Day Smoker -- 0.20 packs/day for 10 years    Types: Cigarettes  . Smokeless tobacco: None  . Alcohol Use: Yes  . Drug Use: No  . Sexual Activity: None   Other Topics Concern  . None   Social History Narrative  . None     REVIEW OF SYSTEMS - PERTINENT POSITIVES ONLY: 12 point review of systems negative other than HPI and PMH except for diarrhea, nausea, vomiting, abdominal, and back pain.    EXAM: Filed Vitals:   11/11/13 1033  BP: 100/80  Pulse: 68  Temp: 98 F (36.7 C)  Resp: 14    Wt Readings from Last 3 Encounters:  11/11/13 180 lb 6.4 oz (81.829 kg)  10/24/13 177 lb 3.2 oz (80.377 kg)  07/22/12 156 lb 6.4 oz (70.943 kg)     Gen:  No acute distress.  Well nourished and well groomed.   Neurological: Alert and oriented to person, place, and time. Coordination normal.  Head: Normocephalic and atraumatic.  Eyes: Conjunctivae are normal. Pupils are equal, round, and reactive to light. No scleral icterus.  Neck: Normal range of motion. Neck supple. No tracheal deviation or thyromegaly present.  Cardiovascular: Normal rate, regular rhythm, normal heart sounds and intact distal pulses.  Exam reveals no gallop and no friction rub.  No murmur heard. Respiratory: Effort normal.  No respiratory distress. No chest wall tenderness. Breath sounds normal.  No wheezes, rales or rhonchi.  GI: Soft. Bowel sounds are normal. The abdomen is soft and nondistended.  There is epigastric and RUQ tenderness.  The left chest wall is tender at the costal margin.  There is no rebound and no guarding.  Musculoskeletal: Normal range of motion. Extremities are nontender.  Lymphadenopathy: No cervical, preauricular, postauricular or axillary adenopathy is present Skin: Skin is warm and dry. No rash noted. No diaphoresis. No erythema. No pallor. No clubbing, cyanosis, or edema.   Psychiatric:  Normal mood and affect. Behavior is normal. Judgment and thought content normal.    LABORATORY RESULTS: Available labs are reviewed   Recent Results (from the past 2160 hour(s))  CBC WITH DIFFERENTIAL     Status: None   Collection Time    08/25/13  9:15 PM      Result Value Ref Range   WBC 10.5  4.0 - 10.5 K/uL   RBC 4.23  3.87 - 5.11 MIL/uL   Hemoglobin 13.9  12.0 - 15.0 g/dL   HCT 39.3  36.0 - 46.0 %   MCV 92.9  78.0 - 100.0 fL   MCH 32.9  26.0 - 34.0 pg   MCHC 35.4  30.0 - 36.0 g/dL   RDW 13.1  11.5 - 15.5 %   Platelets 301  150 - 400 K/uL   Neutrophils Relative % 62  43 - 77 %   Neutro Abs 6.5  1.7 - 7.7 K/uL   Lymphocytes Relative 28  12 - 46 %   Lymphs Abs 3.0  0.7 - 4.0 K/uL   Monocytes Relative 7  3 - 12 %   Monocytes Absolute 0.7  0.1 - 1.0 K/uL   Eosinophils Relative 3  0 - 5 %   Eosinophils Absolute 0.3  0.0 - 0.7 K/uL   Basophils Relative 0  0 - 1 %   Basophils Absolute 0.0  0.0 - 0.1 K/uL  COMPREHENSIVE METABOLIC PANEL     Status: None   Collection Time    08/25/13  9:15 PM      Result Value Ref Range   Sodium 138  137 - 147 mEq/L   Potassium 4.1  3.7 - 5.3 mEq/L   Chloride 102  96 - 112 mEq/L   CO2 20  19 - 32 mEq/L   Glucose, Bld 85  70 - 99 mg/dL   BUN 14  6 - 23 mg/dL   Creatinine, Ser 0.78  0.50 - 1.10 mg/dL   Calcium 9.6  8.4 - 10.5 mg/dL   Total Protein 7.4  6.0 - 8.3 g/dL   Albumin 4.6  3.5 - 5.2 g/dL   AST 18  0 - 37 U/L   ALT 16  0 - 35 U/L   Alkaline Phosphatase 40  39 - 117 U/L   Total Bilirubin 0.5  0.3 - 1.2 mg/dL   GFR calc non Af Amer >90  >90 mL/min   GFR calc Af Amer >90  >90 mL/min   Comment: (NOTE)     The eGFR has been calculated using the CKD EPI equation.     This calculation has not been validated in all clinical situations.     eGFR's persistently <90 mL/min signify possible Chronic Kidney  Disease.  TROPONIN I     Status: None   Collection Time    08/25/13  9:15 PM      Result Value Ref Range   Troponin I <0.30   <0.30 ng/mL   Comment:            Due to the release kinetics of cTnI,     a negative result within the first hours     of the onset of symptoms does not rule out     myocardial infarction with certainty.     If myocardial infarction is still suspected,     repeat the test at appropriate intervals.  URINALYSIS, ROUTINE W REFLEX MICROSCOPIC     Status: Abnormal   Collection Time    08/26/13 12:30 AM      Result Value Ref Range   Color, Urine YELLOW  YELLOW   APPearance TURBID (*) CLEAR   Specific Gravity, Urine 1.018  1.005 - 1.030   pH 5.5  5.0 - 8.0   Glucose, UA NEGATIVE  NEGATIVE mg/dL   Hgb urine dipstick NEGATIVE  NEGATIVE   Bilirubin Urine NEGATIVE  NEGATIVE   Ketones, ur 15 (*) NEGATIVE mg/dL   Protein, ur NEGATIVE  NEGATIVE mg/dL   Urobilinogen, UA 0.2  0.0 - 1.0 mg/dL   Nitrite NEGATIVE  NEGATIVE   Leukocytes, UA MODERATE (*) NEGATIVE  PREGNANCY, URINE     Status: None   Collection Time    08/26/13 12:30 AM      Result Value Ref Range   Preg Test, Ur NEGATIVE  NEGATIVE   Comment:            THE SENSITIVITY OF THIS     METHODOLOGY IS >20 mIU/mL.  URINE MICROSCOPIC-ADD ON     Status: Abnormal   Collection Time    08/26/13 12:30 AM      Result Value Ref Range   Squamous Epithelial / LPF MANY (*) RARE   WBC, UA 3-6  <3 WBC/hpf   RBC / HPF 0-2  <3 RBC/hpf   Bacteria, UA MANY (*) RARE   Urine-Other AMORPHOUS URATES/PHOSPHATES    URINE CULTURE     Status: None   Collection Time    10/24/13 10:58 AM      Result Value Ref Range   Colony Count 75,000 COLONIES/ML     Organism ID, Bacteria Multiple bacterial morphotypes present, none     Organism ID, Bacteria predominant. Suggest appropriate recollection if      Organism ID, Bacteria clinically indicated.    COMPREHENSIVE METABOLIC PANEL     Status: Abnormal   Collection Time    10/24/13 11:00 AM      Result Value Ref Range   Sodium 133 (*) 135 - 145 mEq/L   Potassium 4.3  3.5 - 5.3 mEq/L   Chloride 102  96 - 112  mEq/L   CO2 20  19 - 32 mEq/L   Glucose, Bld 86  70 - 99 mg/dL   BUN 17  6 - 23 mg/dL   Creat 0.79  0.50 - 1.10 mg/dL   Total Bilirubin 0.7  0.2 - 1.2 mg/dL   Alkaline Phosphatase 40  39 - 117 U/L   AST 16  0 - 37 U/L   ALT 17  0 - 35 U/L   Total Protein 7.7  6.0 - 8.3 g/dL   Albumin 5.1  3.5 - 5.2 g/dL   Calcium 9.7  8.4 - 10.5 mg/dL  AMYLASE  Status: None   Collection Time    10/24/13 11:00 AM      Result Value Ref Range   Amylase 37  0 - 105 U/L  LIPASE     Status: None   Collection Time    10/24/13 11:00 AM      Result Value Ref Range   Lipase 22  0 - 75 U/L  POCT CBC     Status: Abnormal   Collection Time    10/24/13 11:20 AM      Result Value Ref Range   WBC 11.0 (*) 4.6 - 10.2 K/uL   Lymph, poc 2.3  0.6 - 3.4   POC LYMPH PERCENT 21.2  10 - 50 %L   MID (cbc) 0.5  0 - 0.9   POC MID % 5.9  0 - 12 %M   POC Granulocyte 8.0 (*) 2 - 6.9   Granulocyte percent 72.9  37 - 80 %G   RBC 4.70  4.04 - 5.48 M/uL   Hemoglobin 15.3  12.2 - 16.2 g/dL   HCT, POC 45.3  37.7 - 47.9 %   MCV 96.4  80 - 97 fL   MCH, POC 32.6 (*) 27 - 31.2 pg   MCHC 33.8  31.8 - 35.4 g/dL   RDW, POC 13.6     Platelet Count, POC 293  142 - 424 K/uL   MPV 10.1  0 - 99.8 fL  POCT UA - MICROSCOPIC ONLY     Status: None   Collection Time    10/24/13 11:20 AM      Result Value Ref Range   WBC, Ur, HPF, POC 4-5     RBC, urine, microscopic 2-3     Bacteria, U Microscopic 1+     Mucus, UA positive     Epithelial cells, urine per micros 5-10     Crystals, Ur, HPF, POC neg     Casts, Ur, LPF, POC neg     Yeast, UA neg     Sperm      POCT URINALYSIS DIPSTICK     Status: None   Collection Time    10/24/13 11:20 AM      Result Value Ref Range   Color, UA dark yellow     Clarity, UA cloudy     Glucose, UA neg     Bilirubin, UA neg     Ketones, UA neg     Spec Grav, UA 1.015     Blood, UA neg     pH, UA 6.0     Protein, UA neg     Urobilinogen, UA 0.2     Nitrite, UA neg     Leukocytes, UA  Negative    POCT URINE PREGNANCY     Status: None   Collection Time    10/24/13 11:20 AM      Result Value Ref Range   Preg Test, Ur Negative       RADIOLOGY RESULTS: See E-Chart or I-Site for most recent results.  Images and reports are reviewed.  US Abdomen Complete  10/24/2013   CLINICAL DATA:  Abdominal pain, nausea, vomiting.  EXAM: ULTRASOUND ABDOMEN COMPLETE  COMPARISON:  None.  FINDINGS: Gallbladder:  2.2 cm gallstone within the gallbladder, mobile. No wall thickening. Negative sonographic Murphy's.  Common bile duct:  Diameter: Normal caliber, 3 mm.  Liver:  No focal lesion identified. Within normal limits in parenchymal echogenicity.  IVC:  No abnormality visualized.  Pancreas:  Visualized portion unremarkable.  Spleen:  Size and appearance within normal limits.  Right Kidney:  Length: 10.8 cm. Echogenicity within normal limits. No mass or hydronephrosis visualized.  Left Kidney:  Length: 10.2 cm. Echogenicity within normal limits. No mass or hydronephrosis visualized.  Abdominal aorta:  No aneurysm visualized.  Other findings:  None.  IMPRESSION: Cholelithiasis with 2.2 cm solitary mobile gallstone. No evidence of acute cholecystitis.   Electronically Signed   By: Rolm Baptise M.D.   On: 10/24/2013 13:28      ASSESSMENT AND PLAN: Chronic cholecystitis with calculus Taylor Huff has some symptoms that did not seem treatable to gallbladder. However, the postprandial nausea, the right upper quadrant soreness, and the epigastric discomfort are consistent with chronic cholecystitis. I advised the Taylor Huff that removal of her gallbladder should help some of the symptoms, but that it may not help her left upper quadrant pain or back pain. I think that her back pain is most likely related to muscular issues from vomiting. I'm not sure why she has left upper quadrant pain. She may end up needing an endoscopy if her symptoms are not relieved with cholecystectomy.  The surgical procedure was  described to the Taylor Huff in detail.  The Taylor Huff was given Neurosurgeon. .  I discussed the incision type and location, the location of the gallbladder, the anatomy of the bile ducts and arteries, and the typical progression of surgery.  I discussed the possibility of converting to an open operation.  I advised of the risks of bleeding, infection, damage to other structures (such as the bile duct, intestine or liver), bile leak, need for other procedures or surgeries, and post op diarrhea/constipation.  We discussed the risk of blood clot.  We discussed the recovery period and post operative restrictions.  The Taylor Huff was advised against taking blood thinners the week before surgery.     The Taylor Huff would like this done as soon as possible.     Milus Height MD Surgical Oncology, General and [facility]      Visit Diagnoses: 1. Chronic cholecystitis with calculus     Primary Care Physician: No PCP Per Taylor Huff

## 2013-11-11 NOTE — Assessment & Plan Note (Signed)
Patient has some symptoms that did not seem treatable to gallbladder. However, the postprandial nausea, the right upper quadrant soreness, and the epigastric discomfort are consistent with chronic cholecystitis. I advised the patient that removal of her gallbladder should help some of the symptoms, but that it may not help her left upper quadrant pain or back pain. I think that her back pain is most likely related to muscular issues from vomiting. I'm not sure why she has left upper quadrant pain. She may end up needing an endoscopy if her symptoms are not relieved with cholecystectomy.  The surgical procedure was described to the patient in detail.  The patient was given Neurosurgeon. .  I discussed the incision type and location, the location of the gallbladder, the anatomy of the bile ducts and arteries, and the typical progression of surgery.  I discussed the possibility of converting to an open operation.  I advised of the risks of bleeding, infection, damage to other structures (such as the bile duct, intestine or liver), bile leak, need for other procedures or surgeries, and post op diarrhea/constipation.  We discussed the risk of blood clot.  We discussed the recovery period and post operative restrictions.  The patient was advised against taking blood thinners the week before surgery.     The patient would like this done as soon as possible.

## 2013-11-11 NOTE — Patient Instructions (Signed)

## 2013-11-11 NOTE — Telephone Encounter (Signed)
Pt called back because we RF'ed it as a medication RF encounter saying pharm wouldn't fill it early for her. Called [facility] and approved the RF. Pt wanted to let you know Dr. Lorelei Huff that she was having her gall bladder removed tomorrow.

## 2013-11-12 ENCOUNTER — Encounter (HOSPITAL_COMMUNITY): Payer: Self-pay | Admitting: *Deleted

## 2013-11-12 ENCOUNTER — Encounter (HOSPITAL_COMMUNITY)
Admission: RE | Disposition: A | Discharge status: Home / Self Care | Payer: Self-pay | Source: Ambulatory Visit | Attending: General Surgery

## 2013-11-12 ENCOUNTER — Ambulatory Visit (HOSPITAL_COMMUNITY): Payer: 59 | Admitting: Certified Registered Nurse Anesthetist

## 2013-11-12 ENCOUNTER — Ambulatory Visit (HOSPITAL_COMMUNITY)
Admission: RE | Admit: 2013-11-12 | Discharge: 2013-11-12 | Disposition: A | Discharge status: Home / Self Care | Payer: 59 | Source: Ambulatory Visit | Attending: General Surgery | Admitting: General Surgery

## 2013-11-12 ENCOUNTER — Encounter (HOSPITAL_COMMUNITY): Payer: 59 | Admitting: Certified Registered Nurse Anesthetist

## 2013-11-12 DIAGNOSIS — Z79899 Other long term (current) drug therapy: Secondary | ICD-10-CM | POA: Insufficient documentation

## 2013-11-12 DIAGNOSIS — F411 Generalized anxiety disorder: Secondary | ICD-10-CM | POA: Insufficient documentation

## 2013-11-12 DIAGNOSIS — Z880 Allergy status to penicillin: Secondary | ICD-10-CM | POA: Insufficient documentation

## 2013-11-12 DIAGNOSIS — F17200 Nicotine dependence, unspecified, uncomplicated: Secondary | ICD-10-CM | POA: Insufficient documentation

## 2013-11-12 DIAGNOSIS — K8010 Calculus of gallbladder with chronic cholecystitis without obstruction: Secondary | ICD-10-CM

## 2013-11-12 DIAGNOSIS — J45909 Unspecified asthma, uncomplicated: Secondary | ICD-10-CM | POA: Insufficient documentation

## 2013-11-12 HISTORY — DX: Personal history of peptic ulcer disease: Z87.11

## 2013-11-12 HISTORY — PX: CHOLECYSTECTOMY: SHX55

## 2013-11-12 HISTORY — DX: Sleep apnea, unspecified: G47.30

## 2013-11-12 HISTORY — DX: Personal history of other diseases of the digestive system: Z87.19

## 2013-11-12 HISTORY — DX: Cardiac murmur, unspecified: R01.1

## 2013-11-12 LAB — CBC WITH DIFFERENTIAL/PLATELET
Basophils Absolute: 0 10*3/uL (ref 0.0–0.1)
Basophils Relative: 0 % (ref 0–1)
EOS PCT: 3 % (ref 0–5)
Eosinophils Absolute: 0.2 10*3/uL (ref 0.0–0.7)
HCT: 36.4 % (ref 36.0–46.0)
HEMOGLOBIN: 12.6 g/dL (ref 12.0–15.0)
LYMPHS ABS: 1.6 10*3/uL (ref 0.7–4.0)
LYMPHS PCT: 28 % (ref 12–46)
MCH: 32.4 pg (ref 26.0–34.0)
MCHC: 34.6 g/dL (ref 30.0–36.0)
MCV: 93.6 fL (ref 78.0–100.0)
MONOS PCT: 8 % (ref 3–12)
Monocytes Absolute: 0.5 10*3/uL (ref 0.1–1.0)
NEUTROS PCT: 61 % (ref 43–77)
Neutro Abs: 3.4 10*3/uL (ref 1.7–7.7)
PLATELETS: 239 10*3/uL (ref 150–400)
RBC: 3.89 MIL/uL (ref 3.87–5.11)
RDW: 13 % (ref 11.5–15.5)
WBC: 5.7 10*3/uL (ref 4.0–10.5)

## 2013-11-12 LAB — COMPREHENSIVE METABOLIC PANEL
ALK PHOS: 41 U/L (ref 39–117)
ALT: 13 U/L (ref 0–35)
AST: 14 U/L (ref 0–37)
Albumin: 3.8 g/dL (ref 3.5–5.2)
Anion gap: 12 (ref 5–15)
BUN: 16 mg/dL (ref 6–23)
CO2: 25 meq/L (ref 19–32)
Calcium: 9 mg/dL (ref 8.4–10.5)
Chloride: 104 mEq/L (ref 96–112)
Creatinine, Ser: 0.85 mg/dL (ref 0.50–1.10)
GLUCOSE: 98 mg/dL (ref 70–99)
POTASSIUM: 4 meq/L (ref 3.7–5.3)
Sodium: 141 mEq/L (ref 137–147)
Total Bilirubin: 0.2 mg/dL — ABNORMAL LOW (ref 0.3–1.2)
Total Protein: 6.8 g/dL (ref 6.0–8.3)

## 2013-11-12 LAB — URINALYSIS, ROUTINE W REFLEX MICROSCOPIC
BILIRUBIN URINE: NEGATIVE
Glucose, UA: NEGATIVE mg/dL
Hgb urine dipstick: NEGATIVE
Ketones, ur: NEGATIVE mg/dL
Leukocytes, UA: NEGATIVE
Nitrite: NEGATIVE
PROTEIN: NEGATIVE mg/dL
Specific Gravity, Urine: 1.024 (ref 1.005–1.030)
UROBILINOGEN UA: 0.2 mg/dL (ref 0.0–1.0)
pH: 5.5 (ref 5.0–8.0)

## 2013-11-12 LAB — HCG, SERUM, QUALITATIVE: Preg, Serum: NEGATIVE

## 2013-11-12 SURGERY — LAPAROSCOPIC CHOLECYSTECTOMY
Anesthesia: General

## 2013-11-12 MED ORDER — LIDOCAINE HCL 1 % IJ SOLN
INTRAMUSCULAR | Status: AC
  Filled 2013-11-12: qty 20

## 2013-11-12 MED ORDER — MEPERIDINE HCL 50 MG/ML IJ SOLN: 6.2500 mg | INTRAMUSCULAR | Status: DC | PRN

## 2013-11-12 MED ORDER — SODIUM CHLORIDE 0.9 % IJ SOLN
INTRAMUSCULAR | Status: AC
  Filled 2013-11-12: qty 10

## 2013-11-12 MED ORDER — MIDAZOLAM HCL 2 MG/2ML IJ SOLN
INTRAMUSCULAR | Status: AC
  Filled 2013-11-12: qty 2

## 2013-11-12 MED ORDER — OXYCODONE-ACETAMINOPHEN 5-325 MG PO TABS: 1.0000 | ORAL_TABLET | ORAL | Status: DC | PRN

## 2013-11-12 MED ORDER — GLYCOPYRROLATE 0.2 MG/ML IJ SOLN
INTRAMUSCULAR | Status: AC
  Filled 2013-11-12: qty 3

## 2013-11-12 MED ORDER — SUCCINYLCHOLINE CHLORIDE 20 MG/ML IJ SOLN
INTRAMUSCULAR | Status: DC | PRN
  Administered 2013-11-12: 100 mg via INTRAVENOUS

## 2013-11-12 MED ORDER — NEOSTIGMINE METHYLSULFATE 10 MG/10ML IV SOLN
INTRAVENOUS | Status: AC
  Filled 2013-11-12: qty 1

## 2013-11-12 MED ORDER — LIDOCAINE HCL (CARDIAC) 20 MG/ML IV SOLN
INTRAVENOUS | Status: AC
  Filled 2013-11-12: qty 5

## 2013-11-12 MED ORDER — FENTANYL CITRATE 0.05 MG/ML IJ SOLN
INTRAMUSCULAR | Status: AC
  Filled 2013-11-12: qty 2

## 2013-11-12 MED ORDER — PROPOFOL 10 MG/ML IV BOLUS
INTRAVENOUS | Status: DC | PRN
  Administered 2013-11-12: 150 mg via INTRAVENOUS

## 2013-11-12 MED ORDER — HYDROMORPHONE HCL PF 1 MG/ML IJ SOLN
INTRAMUSCULAR | Status: DC | PRN
  Administered 2013-11-12 (×2): 1 mg via INTRAVENOUS

## 2013-11-12 MED ORDER — DEXAMETHASONE SODIUM PHOSPHATE 10 MG/ML IJ SOLN
INTRAMUSCULAR | Status: AC
  Filled 2013-11-12: qty 1

## 2013-11-12 MED ORDER — OXYCODONE HCL 5 MG PO TABS
5.0000 mg | ORAL_TABLET | ORAL | Status: DC | PRN
  Administered 2013-11-12: 5 mg via ORAL
  Filled 2013-11-12: qty 1

## 2013-11-12 MED ORDER — MIDAZOLAM HCL 5 MG/5ML IJ SOLN
INTRAMUSCULAR | Status: DC | PRN
  Administered 2013-11-12: 2 mg via INTRAVENOUS

## 2013-11-12 MED ORDER — ROCURONIUM BROMIDE 100 MG/10ML IV SOLN
INTRAVENOUS | Status: DC | PRN
  Administered 2013-11-12: 35 mg via INTRAVENOUS

## 2013-11-12 MED ORDER — PROMETHAZINE HCL 25 MG/ML IJ SOLN
6.2500 mg | INTRAMUSCULAR | Status: DC | PRN
  Administered 2013-11-12: 6.25 mg via INTRAVENOUS

## 2013-11-12 MED ORDER — SODIUM CHLORIDE 0.9 % IJ SOLN: 3.0000 mL | INTRAMUSCULAR | Status: DC | PRN

## 2013-11-12 MED ORDER — CIPROFLOXACIN IN D5W 400 MG/200ML IV SOLN
400.0000 mg | INTRAVENOUS | Status: AC
  Administered 2013-11-12: 400 mg via INTRAVENOUS

## 2013-11-12 MED ORDER — FENTANYL CITRATE 0.05 MG/ML IJ SOLN
25.0000 ug | INTRAMUSCULAR | Status: DC | PRN
  Administered 2013-11-12 (×3): 25 ug via INTRAVENOUS

## 2013-11-12 MED ORDER — ACETAMINOPHEN 10 MG/ML IV SOLN
1000.0000 mg | Freq: Once | INTRAVENOUS | Status: AC
  Administered 2013-11-12: 1000 mg via INTRAVENOUS
  Filled 2013-11-12: qty 100

## 2013-11-12 MED ORDER — FENTANYL CITRATE 0.05 MG/ML IJ SOLN
INTRAMUSCULAR | Status: DC | PRN
Start: 2013-11-12 — End: 2013-11-12
  Administered 2013-11-12 (×2): 100 ug via INTRAVENOUS
  Administered 2013-11-12: 50 ug via INTRAVENOUS

## 2013-11-12 MED ORDER — GLYCOPYRROLATE 0.2 MG/ML IJ SOLN
INTRAMUSCULAR | Status: DC | PRN
  Administered 2013-11-12: 0.6 mg via INTRAVENOUS

## 2013-11-12 MED ORDER — LACTATED RINGERS IV SOLN: INTRAVENOUS | Status: DC

## 2013-11-12 MED ORDER — KETOROLAC TROMETHAMINE 30 MG/ML IJ SOLN
INTRAMUSCULAR | Status: DC | PRN
  Administered 2013-11-12: 30 mg via INTRAVENOUS

## 2013-11-12 MED ORDER — HYDROMORPHONE HCL PF 2 MG/ML IJ SOLN
INTRAMUSCULAR | Status: AC
  Filled 2013-11-12: qty 1

## 2013-11-12 MED ORDER — SCOPOLAMINE 1 MG/3DAYS TD PT72
MEDICATED_PATCH | TRANSDERMAL | Status: DC | PRN
  Administered 2013-11-12: 1 via TRANSDERMAL

## 2013-11-12 MED ORDER — SCOPOLAMINE 1 MG/3DAYS TD PT72
MEDICATED_PATCH | TRANSDERMAL | Status: AC
  Filled 2013-11-12: qty 1

## 2013-11-12 MED ORDER — PROMETHAZINE HCL 25 MG/ML IJ SOLN
INTRAMUSCULAR | Status: AC
  Filled 2013-11-12: qty 1

## 2013-11-12 MED ORDER — ONDANSETRON HCL 4 MG/2ML IJ SOLN
INTRAMUSCULAR | Status: AC
  Filled 2013-11-12: qty 2

## 2013-11-12 MED ORDER — LIDOCAINE HCL 1 % IJ SOLN
INTRAMUSCULAR | Status: DC | PRN
  Administered 2013-11-12: 11 mL

## 2013-11-12 MED ORDER — ACETAMINOPHEN 325 MG PO TABS: 650.0000 mg | ORAL_TABLET | ORAL | Status: DC | PRN

## 2013-11-12 MED ORDER — EPHEDRINE SULFATE 50 MG/ML IJ SOLN
INTRAMUSCULAR | Status: AC
  Filled 2013-11-12: qty 1

## 2013-11-12 MED ORDER — PROPOFOL 10 MG/ML IV BOLUS
INTRAVENOUS | Status: AC
  Filled 2013-11-12: qty 20

## 2013-11-12 MED ORDER — LACTATED RINGERS IV SOLN
INTRAVENOUS | Status: DC | PRN
  Administered 2013-11-12 (×2): via INTRAVENOUS

## 2013-11-12 MED ORDER — LACTATED RINGERS IR SOLN
Status: DC | PRN
  Administered 2013-11-12: 1000 mL

## 2013-11-12 MED ORDER — BUPIVACAINE-EPINEPHRINE (PF) 0.25% -1:200000 IJ SOLN
INTRAMUSCULAR | Status: AC
  Filled 2013-11-12: qty 30

## 2013-11-12 MED ORDER — ONDANSETRON HCL 4 MG/2ML IJ SOLN
INTRAMUSCULAR | Status: DC | PRN
  Administered 2013-11-12: 4 mg via INTRAVENOUS

## 2013-11-12 MED ORDER — DEXAMETHASONE SODIUM PHOSPHATE 10 MG/ML IJ SOLN
INTRAMUSCULAR | Status: DC | PRN
  Administered 2013-11-12: 10 mg via INTRAVENOUS

## 2013-11-12 MED ORDER — SODIUM CHLORIDE 0.9 % IJ SOLN: 3.0000 mL | Freq: Two times a day (BID) | INTRAMUSCULAR | Status: DC

## 2013-11-12 MED ORDER — BUPIVACAINE-EPINEPHRINE 0.25% -1:200000 IJ SOLN
INTRAMUSCULAR | Status: DC | PRN
  Administered 2013-11-12: 11 mL

## 2013-11-12 MED ORDER — EPHEDRINE SULFATE 50 MG/ML IJ SOLN
INTRAMUSCULAR | Status: DC | PRN
  Administered 2013-11-12: 10 mg via INTRAVENOUS

## 2013-11-12 MED ORDER — ACETAMINOPHEN 650 MG RE SUPP
650.0000 mg | RECTAL | Status: DC | PRN
  Filled 2013-11-12: qty 1

## 2013-11-12 MED ORDER — SODIUM CHLORIDE 0.9 % IV SOLN: 250.0000 mL | INTRAVENOUS | Status: DC | PRN

## 2013-11-12 MED ORDER — ROCURONIUM BROMIDE 100 MG/10ML IV SOLN
INTRAVENOUS | Status: AC
  Filled 2013-11-12: qty 1

## 2013-11-12 MED ORDER — NEOSTIGMINE METHYLSULFATE 10 MG/10ML IV SOLN
INTRAVENOUS | Status: DC | PRN
  Administered 2013-11-12: 5 mg via INTRAVENOUS

## 2013-11-12 MED ORDER — CIPROFLOXACIN IN D5W 400 MG/200ML IV SOLN
INTRAVENOUS | Status: AC
  Filled 2013-11-12: qty 200

## 2013-11-12 MED ORDER — FENTANYL CITRATE 0.05 MG/ML IJ SOLN
INTRAMUSCULAR | Status: AC
  Filled 2013-11-12: qty 5

## 2013-11-12 MED ORDER — LIDOCAINE HCL (CARDIAC) 20 MG/ML IV SOLN
INTRAVENOUS | Status: DC | PRN
  Administered 2013-11-12: 100 mg via INTRAVENOUS

## 2013-11-12 SURGICAL SUPPLY — 37 items
APPLIER CLIP 5 13 M/L LIGAMAX5 (MISCELLANEOUS)
APPLIER CLIP ROT 10 11.4 M/L (STAPLE) ×3
APR CLP MED LRG 11.4X10 (STAPLE) ×1
CANISTER SUCTION 2500CC (MISCELLANEOUS) IMPLANT
CHLORAPREP W/TINT 26ML (MISCELLANEOUS) ×3 IMPLANT
CLIP APPLIE 5 13 M/L LIGAMAX5 (MISCELLANEOUS) IMPLANT
CLIP APPLIE ROT 10 11.4 M/L (STAPLE) ×1 IMPLANT
CLIP LIGATING HEMO O LOK GREEN (MISCELLANEOUS) IMPLANT
COVER MAYO STAND STRL (DRAPES) IMPLANT
DECANTER SPIKE VIAL GLASS SM (MISCELLANEOUS) ×3 IMPLANT
DERMABOND ADVANCED (GAUZE/BANDAGES/DRESSINGS) ×2
DERMABOND ADVANCED .7 DNX12 (GAUZE/BANDAGES/DRESSINGS) ×1 IMPLANT
DRAPE C-ARM 42X120 X-RAY (DRAPES) IMPLANT
DRAPE LAPAROSCOPIC ABDOMINAL (DRAPES) ×3 IMPLANT
DRAPE UTILITY XL STRL (DRAPES) ×3 IMPLANT
ELECT REM PT RETURN 9FT ADLT (ELECTROSURGICAL) ×3
ELECTRODE REM PT RTRN 9FT ADLT (ELECTROSURGICAL) ×1 IMPLANT
GLOVE BIO SURGEON STRL SZ 6 (GLOVE) ×3 IMPLANT
GLOVE INDICATOR 6.5 STRL GRN (GLOVE) ×24 IMPLANT
GOWN SPEC L4 XLG W/TWL (GOWN DISPOSABLE) IMPLANT
GOWN STRL REUS W/ TWL XL LVL3 (GOWN DISPOSABLE) ×4 IMPLANT
GOWN STRL REUS W/TWL XL LVL3 (GOWN DISPOSABLE) ×8
KIT BASIN OR (CUSTOM PROCEDURE TRAY) ×3 IMPLANT
POUCH SPECIMEN RETRIEVAL 10MM (ENDOMECHANICALS) ×3 IMPLANT
SCISSORS ENDO CVD 5DCS (MISCELLANEOUS) ×3 IMPLANT
SET CHOLANGIOGRAPH MIX (MISCELLANEOUS) IMPLANT
SET IRRIG TUBING LAPAROSCOPIC (IRRIGATION / IRRIGATOR) ×3 IMPLANT
SOLUTION ANTI FOG 6CC (MISCELLANEOUS) ×3 IMPLANT
SUT MNCRL AB 4-0 PS2 18 (SUTURE) ×3 IMPLANT
TOWEL OR 17X26 10 PK STRL BLUE (TOWEL DISPOSABLE) ×3 IMPLANT
TOWEL OR NON WOVEN STRL DISP B (DISPOSABLE) ×3 IMPLANT
TRAY LAP CHOLE (CUSTOM PROCEDURE TRAY) ×3 IMPLANT
TROCAR BLADELESS OPT 5 75 (ENDOMECHANICALS) ×3 IMPLANT
TROCAR SLEEVE XCEL 5X75 (ENDOMECHANICALS) ×3 IMPLANT
TROCAR XCEL BLUNT TIP 100MML (ENDOMECHANICALS) ×3 IMPLANT
TROCAR XCEL NON-BLD 11X100MML (ENDOMECHANICALS) ×3 IMPLANT
TUBING INSUFFLATION 10FT LAP (TUBING) ×3 IMPLANT

## 2013-11-12 NOTE — Op Note (Signed)
Laparoscopic Cholecystectomy Procedure Note  Indications: This patient presents with biliary colic and chronic cholecystitis and will undergo laparoscopic cholecystectomy.  Pre-operative Diagnosis: see above  Post-operative Diagnosis: Same  Surgeon: Stark Klein   Assistants: Zella Richer, TODD  Anesthesia: General endotracheal anesthesia and local  ASA Class: 2  Procedure Details  The patient was seen again in the Holding Room. The risks, benefits, complications, treatment options, and expected outcomes were discussed with the patient. The possibilities of  bleeding, recurrent infection, damage to nearby structures, the need for additional procedures, failure to diagnose a condition, the possible need to convert to an open procedure, and creating a complication requiring transfusion or operation were discussed with the patient. The likelihood of improving the patient's symptoms with return to their baseline status is good.    The patient and/or family concurred with the proposed plan, giving informed consent. The site of surgery properly noted. The patient was taken to Operating Room, and the procedure verified as Laparoscopic Cholecystectomy with possible Intraoperative Cholangiogram. A Time Out was held and the above information confirmed.  Prior to the induction of general anesthesia, antibiotic prophylaxis was administered. General endotracheal anesthesia was then administered and tolerated well. After the induction, the abdomen was prepped with Chloraprep and draped in the sterile fashion. The patient was positioned in the supine position.  Local anesthetic agent was injected into the skin near the umbilicus and an incision made. We dissected down to the abdominal fascia with blunt dissection.  The fascia was incised vertically and we entered the peritoneal cavity bluntly.  A pursestring suture of 0-Vicryl was placed around the fascial opening.  The Hasson cannula was inserted and secured  with the stay suture.  Pneumoperitoneum was then created with CO2 and tolerated well without any adverse changes in the patient's vital signs. An 11-mm port was placed in the subxiphoid position.  Two 5-mm ports were placed in the right upper quadrant. All skin incisions were infiltrated with a local anesthetic agent before making the incision and placing the trocars.   We positioned the patient in reverse Trendelenburg, tilted slightly to the patient's left.  The gallbladder was identified, the fundus grasped and retracted cephalad. Adhesions were lysed bluntly and with the electrocautery where indicated, taking care not to injure any adjacent organs or viscus. The infundibulum was grasped and retracted laterally, exposing the peritoneum overlying the triangle of Calot. This was then divided and exposed in a blunt fashion. A critical view of the cystic duct and cystic artery was obtained.  The cystic duct was clearly identified and bluntly dissected circumferentially. It was very small.   The cystic artery was identified and dissected free.  The peritoneum was opened up to obtain a critical view in which only the cystic duct and cystic artery were seen entering the gallbladder.  The cystic duct was ligated with a clip distally and 4 clips proximally, then it was divided.  The cystic artery was ligated with clips and divided as well.   The gallbladder was dissected from the liver bed in retrograde fashion with the electrocautery. The gallbladder was removed and placed in an Endocatch bag.  The gallbladder and Endocatch bag were then removed through the umbilical port site.  The liver bed was irrigated and inspected. Hemostasis was achieved with the electrocautery. Copious irrigation was utilized and was repeatedly aspirated until clear.  There was an omental adhesion in the umbilicus that was taken down with hot maryland forceps.   We again inspected the right upper quadrant  for hemostasis.  Pneumoperitoneum  was released as we removed the trocars.   The pursestring suture was used to close the umbilical fascia.  4-0 Monocryl was used to close the skin.   The skin was cleaned and dry, and Dermabond was applied. The patient was then extubated and brought to the recovery room in stable condition. Instrument, sponge, and needle counts were correct at closure and at the conclusion of the case.   Findings: Mild chronic inflammation.    Estimated Blood Loss: min         Drains: none          Specimens: Gallbladder to pathology       Complications: None; patient tolerated the procedure well.         Disposition: PACU - hemodynamically stable.         Condition: stable

## 2013-11-12 NOTE — Anesthesia Postprocedure Evaluation (Signed)
  Anesthesia Post-op Note  Patient: Taylor Huff  Procedure(s) Performed: Procedure(s) (LRB): LAPAROSCOPIC CHOLECYSTECTOMY POSSIBLE IOC (N/A)  Patient Location: PACU  Anesthesia Type: General  Level of Consciousness: awake and alert   Airway and Oxygen Therapy: Patient Spontanous Breathing  Post-op Pain: mild  Post-op Assessment: Post-op Vital signs reviewed, Patient's Cardiovascular Status Stable, Respiratory Function Stable, Patent Airway and No signs of Nausea or vomiting  Last Vitals:  Filed Vitals:   11/12/13 0945  BP:   Pulse:   Temp: 36.5 C  Resp:     Post-op Vital Signs: stable   Complications: No apparent anesthesia complications

## 2013-11-12 NOTE — Anesthesia Preprocedure Evaluation (Addendum)
Anesthesia Evaluation  Patient identified by MRN, date of birth, ID band Patient awake    Reviewed: Allergy & Precautions, H&P , NPO status , Patient's Chart, lab work & pertinent test results  Airway Mallampati: III TM Distance: >3 FB Neck ROM: Full    Dental no notable dental hx. (+) Teeth Intact   Pulmonary asthma , Current Smoker,  breath sounds clear to auscultation  Pulmonary exam normal       Cardiovascular negative cardio ROS  Rhythm:Regular Rate:Normal     Neuro/Psych negative neurological ROS  negative psych ROS   GI/Hepatic negative GI ROS, Neg liver ROS,   Endo/Other  negative endocrine ROS  Renal/GU negative Renal ROS  negative genitourinary   Musculoskeletal negative musculoskeletal ROS (+)   Abdominal   Peds negative pediatric ROS (+)  Hematology negative hematology ROS (+)   Anesthesia Other Findings   Reproductive/Obstetrics negative OB ROS                         Anesthesia Physical Anesthesia Plan  ASA: II  Anesthesia Plan: General   Post-op Pain Management:    Induction: Intravenous  Airway Management Planned: Oral ETT  Additional Equipment:   Intra-op Plan:   Post-operative Plan: Extubation in OR  Informed Consent: I have reviewed the patients History and Physical, chart, labs and discussed the procedure including the risks, benefits and alternatives for the proposed anesthesia with the patient or authorized representative who has indicated his/her understanding and acceptance.   Dental advisory given  Plan Discussed with: CRNA  Anesthesia Plan Comments:         Anesthesia Quick Evaluation

## 2013-11-12 NOTE — Transfer of Care (Signed)
Immediate Anesthesia Transfer of Care Note  Patient: Taylor Huff  Procedure(s) Performed: Procedure(s) (LRB): LAPAROSCOPIC CHOLECYSTECTOMY POSSIBLE IOC (N/A)  Patient Location: PACU  Anesthesia Type: General  Level of Consciousness: sedated, patient cooperative and responds to stimulation  Airway & Oxygen Therapy: Patient Spontanous Breathing and Patient connected to face mask oxgen  Post-op Assessment: Report given to PACU RN and Post -op Vital signs reviewed and stable  Post vital signs: Reviewed and stable  Complications: No apparent anesthesia complications

## 2013-11-12 NOTE — H&P (View-Only) (Signed)
Chief Complaint  Patient presents with  . Cholelithiasis    HISTORY:  The patient is a 33 year old female who is referred by Dr. Silvestre Mesi for consultation regarding her cholelithiasis.  She has had significant abdominal pain that has sent her to the ER within the past month. She is also having postprandial nausea and vomiting. She went to the emergency department and gotten some rib films. These were negative. She also had a dirty urine and was diagnosed with a UTI. She continued to have discomfort and more of this seemed to migrate to the epigastric location and the right upper quadrant. When she saw the Dr. Edilia Bo at urgent care, she was noted to have right upper quadrant tenderness and an ultrasound was requested. This demonstrated gallstones.  The post prandial nausea has gotten worse and more frequent.    Past Medical History  Diagnosis Date  . Anxiety     Past Surgical History  Procedure Laterality Date  . Tubal ligation    . Hemmorhoids      Current Outpatient Prescriptions  Medication Sig Dispense Refill  . ALPRAZolam (XANAX) 1 MG tablet TAKE 1/2 TABLET UP TO TWICE A DAY AS NEEDED FOR ANXIETY AND INSOMNIA  30 tablet  0  . ibuprofen (ADVIL,MOTRIN) 800 MG tablet Take 1 tablet (800 mg total) by mouth 3 (three) times daily.  21 tablet  0  . loratadine (CLARITIN) 10 MG tablet Take 10 mg by mouth daily.      . methocarbamol (ROBAXIN) 500 MG tablet Take 1 tablet (500 mg total) by mouth every 8 (eight) hours as needed for muscle spasms.  30 tablet  0  . sucralfate (CARAFATE) 1 G tablet Take 1 tablet (1 g total) by mouth 4 (four) times daily -  with meals and at bedtime.  40 tablet  0   No current facility-administered medications for this visit.     Allergies  Allergen Reactions  . Penicillins Swelling     Family History  Problem Relation Age of Onset  . Hypertension Mother      History   Social History  . Marital Status: Single    Spouse Name: N/A    Number  of Children: N/A  . Years of Education: N/A   Social History Main Topics  . Smoking status: Current Every Day Smoker -- 0.20 packs/day for 10 years    Types: Cigarettes  . Smokeless tobacco: None  . Alcohol Use: Yes  . Drug Use: No  . Sexual Activity: None   Other Topics Concern  . None   Social History Narrative  . None     REVIEW OF SYSTEMS - PERTINENT POSITIVES ONLY: 12 point review of systems negative other than HPI and PMH except for diarrhea, nausea, vomiting, abdominal, and back pain.    EXAM: Filed Vitals:   11/11/13 1033  BP: 100/80  Pulse: 68  Temp: 98 F (36.7 C)  Resp: 14    Wt Readings from Last 3 Encounters:  11/11/13 180 lb 6.4 oz (81.829 kg)  10/24/13 177 lb 3.2 oz (80.377 kg)  07/22/12 156 lb 6.4 oz (70.943 kg)     Gen:  No acute distress.  Well nourished and well groomed.   Neurological: Alert and oriented to person, place, and time. Coordination normal.  Head: Normocephalic and atraumatic.  Eyes: Conjunctivae are normal. Pupils are equal, round, and reactive to light. No scleral icterus.  Neck: Normal range of motion. Neck supple. No tracheal deviation or thyromegaly present.  Cardiovascular: Normal rate, regular rhythm, normal heart sounds and intact distal pulses.  Exam reveals no gallop and no friction rub.  No murmur heard. Respiratory: Effort normal.  No respiratory distress. No chest wall tenderness. Breath sounds normal.  No wheezes, rales or rhonchi.  GI: Soft. Bowel sounds are normal. The abdomen is soft and nondistended.  There is epigastric and RUQ tenderness.  The left chest wall is tender at the costal margin.  There is no rebound and no guarding.  Musculoskeletal: Normal range of motion. Extremities are nontender.  Lymphadenopathy: No cervical, preauricular, postauricular or axillary adenopathy is present Skin: Skin is warm and dry. No rash noted. No diaphoresis. No erythema. No pallor. No clubbing, cyanosis, or edema.   Psychiatric:  Normal mood and affect. Behavior is normal. Judgment and thought content normal.    LABORATORY RESULTS: Available labs are reviewed   Recent Results (from the past 2160 hour(s))  CBC WITH DIFFERENTIAL     Status: None   Collection Time    08/25/13  9:15 PM      Result Value Ref Range   WBC 10.5  4.0 - 10.5 K/uL   RBC 4.23  3.87 - 5.11 MIL/uL   Hemoglobin 13.9  12.0 - 15.0 g/dL   HCT 39.3  36.0 - 46.0 %   MCV 92.9  78.0 - 100.0 fL   MCH 32.9  26.0 - 34.0 pg   MCHC 35.4  30.0 - 36.0 g/dL   RDW 13.1  11.5 - 15.5 %   Platelets 301  150 - 400 K/uL   Neutrophils Relative % 62  43 - 77 %   Neutro Abs 6.5  1.7 - 7.7 K/uL   Lymphocytes Relative 28  12 - 46 %   Lymphs Abs 3.0  0.7 - 4.0 K/uL   Monocytes Relative 7  3 - 12 %   Monocytes Absolute 0.7  0.1 - 1.0 K/uL   Eosinophils Relative 3  0 - 5 %   Eosinophils Absolute 0.3  0.0 - 0.7 K/uL   Basophils Relative 0  0 - 1 %   Basophils Absolute 0.0  0.0 - 0.1 K/uL  COMPREHENSIVE METABOLIC PANEL     Status: None   Collection Time    08/25/13  9:15 PM      Result Value Ref Range   Sodium 138  137 - 147 mEq/L   Potassium 4.1  3.7 - 5.3 mEq/L   Chloride 102  96 - 112 mEq/L   CO2 20  19 - 32 mEq/L   Glucose, Bld 85  70 - 99 mg/dL   BUN 14  6 - 23 mg/dL   Creatinine, Ser 0.78  0.50 - 1.10 mg/dL   Calcium 9.6  8.4 - 10.5 mg/dL   Total Protein 7.4  6.0 - 8.3 g/dL   Albumin 4.6  3.5 - 5.2 g/dL   AST 18  0 - 37 U/L   ALT 16  0 - 35 U/L   Alkaline Phosphatase 40  39 - 117 U/L   Total Bilirubin 0.5  0.3 - 1.2 mg/dL   GFR calc non Af Amer >90  >90 mL/min   GFR calc Af Amer >90  >90 mL/min   Comment: (NOTE)     The eGFR has been calculated using the CKD EPI equation.     This calculation has not been validated in all clinical situations.     eGFR's persistently <90 mL/min signify possible Chronic Kidney  Disease.  TROPONIN I     Status: None   Collection Time    08/25/13  9:15 PM      Result Value Ref Range   Troponin I <0.30   <0.30 ng/mL   Comment:            Due to the release kinetics of cTnI,     a negative result within the first hours     of the onset of symptoms does not rule out     myocardial infarction with certainty.     If myocardial infarction is still suspected,     repeat the test at appropriate intervals.  URINALYSIS, ROUTINE W REFLEX MICROSCOPIC     Status: Abnormal   Collection Time    08/26/13 12:30 AM      Result Value Ref Range   Color, Urine YELLOW  YELLOW   APPearance TURBID (*) CLEAR   Specific Gravity, Urine 1.018  1.005 - 1.030   pH 5.5  5.0 - 8.0   Glucose, UA NEGATIVE  NEGATIVE mg/dL   Hgb urine dipstick NEGATIVE  NEGATIVE   Bilirubin Urine NEGATIVE  NEGATIVE   Ketones, ur 15 (*) NEGATIVE mg/dL   Protein, ur NEGATIVE  NEGATIVE mg/dL   Urobilinogen, UA 0.2  0.0 - 1.0 mg/dL   Nitrite NEGATIVE  NEGATIVE   Leukocytes, UA MODERATE (*) NEGATIVE  PREGNANCY, URINE     Status: None   Collection Time    08/26/13 12:30 AM      Result Value Ref Range   Preg Test, Ur NEGATIVE  NEGATIVE   Comment:            THE SENSITIVITY OF THIS     METHODOLOGY IS >20 mIU/mL.  URINE MICROSCOPIC-ADD ON     Status: Abnormal   Collection Time    08/26/13 12:30 AM      Result Value Ref Range   Squamous Epithelial / LPF MANY (*) RARE   WBC, UA 3-6  <3 WBC/hpf   RBC / HPF 0-2  <3 RBC/hpf   Bacteria, UA MANY (*) RARE   Urine-Other AMORPHOUS URATES/PHOSPHATES    URINE CULTURE     Status: None   Collection Time    10/24/13 10:58 AM      Result Value Ref Range   Colony Count 75,000 COLONIES/ML     Organism ID, Bacteria Multiple bacterial morphotypes present, none     Organism ID, Bacteria predominant. Suggest appropriate recollection if      Organism ID, Bacteria clinically indicated.    COMPREHENSIVE METABOLIC PANEL     Status: Abnormal   Collection Time    10/24/13 11:00 AM      Result Value Ref Range   Sodium 133 (*) 135 - 145 mEq/L   Potassium 4.3  3.5 - 5.3 mEq/L   Chloride 102  96 - 112  mEq/L   CO2 20  19 - 32 mEq/L   Glucose, Bld 86  70 - 99 mg/dL   BUN 17  6 - 23 mg/dL   Creat 0.79  0.50 - 1.10 mg/dL   Total Bilirubin 0.7  0.2 - 1.2 mg/dL   Alkaline Phosphatase 40  39 - 117 U/L   AST 16  0 - 37 U/L   ALT 17  0 - 35 U/L   Total Protein 7.7  6.0 - 8.3 g/dL   Albumin 5.1  3.5 - 5.2 g/dL   Calcium 9.7  8.4 - 10.5 mg/dL  AMYLASE  Status: None   Collection Time    10/24/13 11:00 AM      Result Value Ref Range   Amylase 37  0 - 105 U/L  LIPASE     Status: None   Collection Time    10/24/13 11:00 AM      Result Value Ref Range   Lipase 22  0 - 75 U/L  POCT CBC     Status: Abnormal   Collection Time    10/24/13 11:20 AM      Result Value Ref Range   WBC 11.0 (*) 4.6 - 10.2 K/uL   Lymph, poc 2.3  0.6 - 3.4   POC LYMPH PERCENT 21.2  10 - 50 %L   MID (cbc) 0.5  0 - 0.9   POC MID % 5.9  0 - 12 %M   POC Granulocyte 8.0 (*) 2 - 6.9   Granulocyte percent 72.9  37 - 80 %G   RBC 4.70  4.04 - 5.48 M/uL   Hemoglobin 15.3  12.2 - 16.2 g/dL   HCT, POC 45.3  37.7 - 47.9 %   MCV 96.4  80 - 97 fL   MCH, POC 32.6 (*) 27 - 31.2 pg   MCHC 33.8  31.8 - 35.4 g/dL   RDW, POC 13.6     Platelet Count, POC 293  142 - 424 K/uL   MPV 10.1  0 - 99.8 fL  POCT UA - MICROSCOPIC ONLY     Status: None   Collection Time    10/24/13 11:20 AM      Result Value Ref Range   WBC, Ur, HPF, POC 4-5     RBC, urine, microscopic 2-3     Bacteria, U Microscopic 1+     Mucus, UA positive     Epithelial cells, urine per micros 5-10     Crystals, Ur, HPF, POC neg     Casts, Ur, LPF, POC neg     Yeast, UA neg     Sperm      POCT URINALYSIS DIPSTICK     Status: None   Collection Time    10/24/13 11:20 AM      Result Value Ref Range   Color, UA dark yellow     Clarity, UA cloudy     Glucose, UA neg     Bilirubin, UA neg     Ketones, UA neg     Spec Grav, UA 1.015     Blood, UA neg     pH, UA 6.0     Protein, UA neg     Urobilinogen, UA 0.2     Nitrite, UA neg     Leukocytes, UA  Negative    POCT URINE PREGNANCY     Status: None   Collection Time    10/24/13 11:20 AM      Result Value Ref Range   Preg Test, Ur Negative       RADIOLOGY RESULTS: See E-Chart or I-Site for most recent results.  Images and reports are reviewed.  US Abdomen Complete  10/24/2013   CLINICAL DATA:  Abdominal pain, nausea, vomiting.  EXAM: ULTRASOUND ABDOMEN COMPLETE  COMPARISON:  None.  FINDINGS: Gallbladder:  2.2 cm gallstone within the gallbladder, mobile. No wall thickening. Negative sonographic Murphy's.  Common bile duct:  Diameter: Normal caliber, 3 mm.  Liver:  No focal lesion identified. Within normal limits in parenchymal echogenicity.  IVC:  No abnormality visualized.  Pancreas:  Visualized portion unremarkable.  Spleen:  Size and appearance within normal limits.  Right Kidney:  Length: 10.8 cm. Echogenicity within normal limits. No mass or hydronephrosis visualized.  Left Kidney:  Length: 10.2 cm. Echogenicity within normal limits. No mass or hydronephrosis visualized.  Abdominal aorta:  No aneurysm visualized.  Other findings:  None.  IMPRESSION: Cholelithiasis with 2.2 cm solitary mobile gallstone. No evidence of acute cholecystitis.   Electronically Signed   By: Rolm Baptise M.D.   On: 10/24/2013 13:28      ASSESSMENT AND PLAN: Chronic cholecystitis with calculus Patient has some symptoms that did not seem treatable to gallbladder. However, the postprandial nausea, the right upper quadrant soreness, and the epigastric discomfort are consistent with chronic cholecystitis. I advised the patient that removal of her gallbladder should help some of the symptoms, but that it may not help her left upper quadrant pain or back pain. I think that her back pain is most likely related to muscular issues from vomiting. I'm not sure why she has left upper quadrant pain. She may end up needing an endoscopy if her symptoms are not relieved with cholecystectomy.  The surgical procedure was  described to the patient in detail.  The patient was given Neurosurgeon. .  I discussed the incision type and location, the location of the gallbladder, the anatomy of the bile ducts and arteries, and the typical progression of surgery.  I discussed the possibility of converting to an open operation.  I advised of the risks of bleeding, infection, damage to other structures (such as the bile duct, intestine or liver), bile leak, need for other procedures or surgeries, and post op diarrhea/constipation.  We discussed the risk of blood clot.  We discussed the recovery period and post operative restrictions.  The patient was advised against taking blood thinners the week before surgery.     The patient would like this done as soon as possible.     Milus Height MD Surgical Oncology, [facility]      Visit Diagnoses: 1. Chronic cholecystitis with calculus     Primary Care Physician: No PCP Per Patient

## 2013-11-12 NOTE — Discharge Instructions (Signed)
[facility], [place] [phone]  ABDOMINAL SURGERY: POST OP INSTRUCTIONS  Always review your discharge instruction sheet given to you by the facility where your surgery was performed.  IF YOU HAVE DISABILITY OR FAMILY LEAVE FORMS, YOU MUST BRING THEM TO THE OFFICE FOR PROCESSING.  PLEASE DO NOT GIVE THEM TO YOUR DOCTOR.  1. A prescription for pain medication may be given to you upon discharge.  Take your pain medication as prescribed, if needed.  If narcotic pain medicine is not needed, then you may take acetaminophen (Tylenol) or ibuprofen (Advil) as needed. 2. Take your usually prescribed medications unless otherwise directed. 3. If you need a refill on your pain medication, please contact your pharmacy. They will contact our office to request authorization.  Prescriptions will not be filled after 5pm or on week-ends. 4. You should follow a light diet the first few days after arrival home, such as soup and crackers, pudding, etc.unless your doctor has advised otherwise. A high-fiber, low fat diet can be resumed as tolerated.   Be sure to include lots of fluids daily. Most patients will experience some swelling and bruising on the chest and neck area.  Ice packs will help.  Swelling and bruising can take several days to resolve 5. Most patients will experience some swelling and bruising in the area of the incision. Ice pack will help. Swelling and bruising can take several days to resolve..  6. It is common to experience some constipation if taking pain medication after surgery.  Increasing fluid intake and taking a stool softener will usually help or prevent this problem from occurring.  A mild laxative (Milk of Magnesia or Miralax) should be taken according to package directions if there are no bowel movements after 48 hours. 7.  You may have steri-strips (small skin tapes) in place directly over the incision.  These strips should be left on the skin for 10-14 days.  If your  surgeon used skin glue on the incision, you may shower in 48 hours.  The glue will flake off over the next 2-3 weeks.  Any sutures or staples will be removed at the office during your follow-up visit. You may find that a light gauze bandage over your incision may keep your staples from being rubbed or pulled. You may shower and replace the bandage daily. 8. ACTIVITIES:  You may resume regular (light) daily activities beginning the next day--such as daily self-care, walking, climbing stairs--gradually increasing activities as tolerated.  You may have sexual intercourse when it is comfortable.  Refrain from any heavy lifting or straining until approved by your doctor (2 weeks) a. You may drive when you no longer are taking prescription pain medication, you can comfortably wear a seatbelt, and you can safely maneuver your car and apply brakes b. Return to Work: __________2-1 weeks if applicable_________________________ 9. You should see your doctor in the office for a follow-up appointment approximately two weeks after your surgery.  Make sure that you call for this appointment within a day or two after you arrive home to insure a convenient appointment time. OTHER INSTRUCTIONS:  _____________________________________________________________ _____________________________________________________________  WHEN TO CALL YOUR DOCTOR: 1. Fever over 101.0 2. Inability to urinate 3. Nausea and/or vomiting 4. Extreme swelling or bruising 5. Continued bleeding from incision. 6. Increased pain, redness, or drainage from the incision. 7. Difficulty swallowing or breathing 8. Muscle cramping or spasms. 9. Numbness or tingling in hands or feet or around lips.  The clinic  staff is available to answer your questions during regular business hours.  Please dont hesitate to call and ask to speak to one of the nurses if you have concerns.  For further questions, please visit [email or web address]

## 2013-11-12 NOTE — Interval H&P Note (Signed)
History and Physical Interval Note:  11/12/2013 7:28 AM  Taylor Huff  has presented today for surgery, with the diagnosis of chronic cholecystitis with calculus  The various methods of treatment have been discussed with the patient and family. After consideration of risks, benefits and other options for treatment, the patient has consented to  Procedure(s): LAPAROSCOPIC CHOLECYSTECTOMY POSSIBLE IOC (N/A) as a surgical intervention .  The patient's history has been reviewed, patient examined, no change in status, stable for surgery.  I have reviewed the patient's chart and labs.  Questions were answered to the patient's satisfaction.     Dayton Kenley

## 2013-11-16 ENCOUNTER — Encounter (HOSPITAL_COMMUNITY): Payer: Self-pay | Admitting: General Surgery

## 2013-11-18 NOTE — Telephone Encounter (Signed)
mychart message not read.  Called and LMOM- please be sure to keep meds safe because I will not be able to do an early refill again

## 2013-11-24 ENCOUNTER — Telehealth (INDEPENDENT_AMBULATORY_CARE_PROVIDER_SITE_OTHER): Payer: Self-pay

## 2013-11-24 NOTE — Telephone Encounter (Signed)
Pt is post op lap chole on 11/12/13.  Dr. Marlowe Aschoff first available is 12/14/13.  Pt is not happy with this date.  Pls explain to her that Dr. Barry Dienes is out of the office for two weeks in July.  Waiting until 8/3 should be fine.  If the patient has any questions or concerns she may call us at anytime.

## 2013-12-15 ENCOUNTER — Other Ambulatory Visit (INDEPENDENT_AMBULATORY_CARE_PROVIDER_SITE_OTHER): Payer: Self-pay

## 2013-12-15 ENCOUNTER — Telehealth (INDEPENDENT_AMBULATORY_CARE_PROVIDER_SITE_OTHER): Payer: Self-pay

## 2013-12-15 DIAGNOSIS — Z9049 Acquired absence of other specified parts of digestive tract: Secondary | ICD-10-CM

## 2013-12-15 DIAGNOSIS — R197 Diarrhea, unspecified: Secondary | ICD-10-CM

## 2013-12-15 NOTE — Telephone Encounter (Signed)
Check c diff and cmet.  Also start questran 1 pack TID.  If too expensive, can do imodium 2 tabs BID.

## 2013-12-15 NOTE — Telephone Encounter (Signed)
Pt s/p lap chole on 11/12/13 by Dr Barry Dienes. Pt states that a couple days ago she started having n/v and diarrhea. Pt states that she has been going to the bathroom at least 12 times a day. Pt states that she has gotten a rash from having to go so often. Pt states that she has been able to keep some foods down. Pt denies any fevers or chills. Pt denies any pain or tenderness. Advised pt to make sure that she is staying hydrated and that she eats foods high in fiber. Informed pt that I would send Dr Barry Dienes a message for any further recommendations. Pt verbalized understanding.

## 2013-12-15 NOTE — Telephone Encounter (Signed)
Informed pt of Dr Marlowe Aschoff recommendations. Pt states that she will go by [facility] this afternoon. Pt states that she will try the Imodium first then contact us back if this does not work.

## 2013-12-16 LAB — COMPREHENSIVE METABOLIC PANEL
ALT: 15 U/L (ref 0–35)
AST: 15 U/L (ref 0–37)
Albumin: 4.8 g/dL (ref 3.5–5.2)
Alkaline Phosphatase: 49 U/L (ref 39–117)
BILIRUBIN TOTAL: 0.7 mg/dL (ref 0.2–1.2)
BUN: 14 mg/dL (ref 6–23)
CALCIUM: 9.5 mg/dL (ref 8.4–10.5)
CHLORIDE: 103 meq/L (ref 96–112)
CO2: 20 meq/L (ref 19–32)
Creat: 0.86 mg/dL (ref 0.50–1.10)
Glucose, Bld: 75 mg/dL (ref 70–99)
Potassium: 4 mEq/L (ref 3.5–5.3)
SODIUM: 135 meq/L (ref 135–145)
TOTAL PROTEIN: 7.2 g/dL (ref 6.0–8.3)

## 2013-12-16 NOTE — Progress Notes (Signed)
Quick Note:  Please let patient know labs are normal. ______

## 2013-12-17 ENCOUNTER — Telehealth (INDEPENDENT_AMBULATORY_CARE_PROVIDER_SITE_OTHER): Payer: Self-pay

## 2013-12-17 NOTE — Telephone Encounter (Signed)
All labs normal

## 2013-12-17 NOTE — Telephone Encounter (Signed)
Informed pt of message below. Pt states that her diarrhea has gotten some better since starting the Imodium. Pt states that she has since started having a burning sensation in her stomach. Advised the pt that she needs to make sure that she is eating bland foods or a while, and make sure that she is not eating anything greasy. Pt verbalized understanding and agrees with POC.

## 2013-12-18 ENCOUNTER — Encounter (INDEPENDENT_AMBULATORY_CARE_PROVIDER_SITE_OTHER): Payer: 59 | Admitting: General Surgery

## 2013-12-21 ENCOUNTER — Encounter (INDEPENDENT_AMBULATORY_CARE_PROVIDER_SITE_OTHER): Payer: 59 | Admitting: General Surgery

## 2013-12-25 ENCOUNTER — Ambulatory Visit (INDEPENDENT_AMBULATORY_CARE_PROVIDER_SITE_OTHER): Payer: 59 | Admitting: General Surgery

## 2013-12-25 VITALS — BP 106/60 | HR 73 | Temp 98.9°F | Ht 67.0 in | Wt 181.0 lb

## 2013-12-25 DIAGNOSIS — K9189 Other postprocedural complications and disorders of digestive system: Secondary | ICD-10-CM

## 2013-12-25 DIAGNOSIS — R197 Diarrhea, unspecified: Secondary | ICD-10-CM

## 2013-12-25 DIAGNOSIS — R1012 Left upper quadrant pain: Secondary | ICD-10-CM

## 2013-12-25 DIAGNOSIS — T8189XA Other complications of procedures, not elsewhere classified, initial encounter: Secondary | ICD-10-CM

## 2013-12-25 DIAGNOSIS — K8010 Calculus of gallbladder with chronic cholecystitis without obstruction: Secondary | ICD-10-CM

## 2013-12-25 DIAGNOSIS — Z9049 Acquired absence of other specified parts of digestive tract: Principal | ICD-10-CM

## 2013-12-25 MED ORDER — CHOLESTYRAMINE LIGHT 4 G PO PACK: 4.0000 g | PACK | Freq: Three times a day (TID) | ORAL | Status: DC

## 2013-12-25 MED ORDER — ONDANSETRON HCL 4 MG PO TABS: 4.0000 mg | ORAL_TABLET | Freq: Three times a day (TID) | ORAL | Status: DC | PRN

## 2013-12-25 NOTE — Patient Instructions (Signed)
We will give you a script for questran for diarrhea and zofran for nausea.  We will set you up for a CT and an GI referral for endoscopy.

## 2013-12-27 DIAGNOSIS — R1012 Left upper quadrant pain: Secondary | ICD-10-CM | POA: Insufficient documentation

## 2013-12-27 NOTE — Progress Notes (Signed)
HISTORY: Pt is around 2-3 weeks s/p lap chole for chronic cholecystitis.  Some of her symptoms have improved, but she is having some loose stools and continues to have left upper quadrant pain.  She denies fever/ chills.  She is having episodes of "nausea" in which she describes sudden sensation of something in her throat and she she throws up.      EXAM: General:  Alert and oriented.   Incision:  Healing well.  No evidence of infection   PATHOLOGY: Chronic cholecystitis with cholelithiasis.   ASSESSMENT AND PLAN:   Abdominal pain, left upper quadrant I had advised the patient pre op that her left sided abdominal pain would not get better after removing gallbladder.  We will need to work that up additionally.    CT scan Refer for GI evaluation/endoscopy.    Chronic cholecystitis with calculus No evidence of surgical complications.    Follow up to be determined.  Pt given script for zofran, but I think the nausea is more reflux than anything.        Milus Height, MD Surgical Oncology, [facility]  No PCP Per Patient No ref. provider found

## 2013-12-27 NOTE — Assessment & Plan Note (Addendum)
No evidence of surgical complications.    Follow up to be determined.  Pt given script for zofran, but I think the nausea is more reflux than anything.

## 2013-12-27 NOTE — Assessment & Plan Note (Addendum)
I had advised the patient pre op that her left sided abdominal pain would not get better after removing gallbladder.  We will need to work that up additionally.    CT scan Refer for GI evaluation/endoscopy.

## 2013-12-29 ENCOUNTER — Other Ambulatory Visit (INDEPENDENT_AMBULATORY_CARE_PROVIDER_SITE_OTHER): Payer: Self-pay | Admitting: General Surgery

## 2013-12-29 DIAGNOSIS — R1012 Left upper quadrant pain: Secondary | ICD-10-CM

## 2013-12-29 DIAGNOSIS — R112 Nausea with vomiting, unspecified: Secondary | ICD-10-CM

## 2014-01-01 ENCOUNTER — Telehealth (INDEPENDENT_AMBULATORY_CARE_PROVIDER_SITE_OTHER): Payer: Self-pay | Admitting: *Deleted

## 2014-01-01 ENCOUNTER — Ambulatory Visit
Admission: RE | Admit: 2014-01-01 | Discharge: 2014-01-01 | Disposition: A | Discharge status: Home / Self Care | Payer: 59 | Source: Ambulatory Visit | Attending: General Surgery | Admitting: General Surgery

## 2014-01-01 DIAGNOSIS — R1012 Left upper quadrant pain: Secondary | ICD-10-CM

## 2014-01-01 MED ORDER — IOHEXOL 300 MG/ML  SOLN
100.0000 mL | Freq: Once | INTRAMUSCULAR | Status: AC | PRN
  Administered 2014-01-01: 100 mL via INTRAVENOUS

## 2014-01-01 NOTE — Telephone Encounter (Signed)
Pt called regarding her referral to [facility].  She said the soonest they could see her was 10.21.15 and she was hoping to get in to see someone before 9.1.15.  I explained to her that I'm not sure if that was feasible since that was only a little over a week away.  Pt stated that she was going to try and call around and see if she could find an appointment sooner.  I advised pt that was ok and if they needed anything from [place], just have them to call.  Pt just wanted me to let Dr. Barry Dienes know.  Anderson Malta

## 2014-01-01 NOTE — Progress Notes (Signed)
Quick Note:  Please let patient know that CT is normal. ______

## 2014-01-04 ENCOUNTER — Telehealth (INDEPENDENT_AMBULATORY_CARE_PROVIDER_SITE_OTHER): Payer: Self-pay

## 2014-01-04 DIAGNOSIS — R197 Diarrhea, unspecified: Secondary | ICD-10-CM

## 2014-01-04 NOTE — Telephone Encounter (Signed)
Pt was given normal CT results.  She is still having chronic diarrhea and she was up last night once vomiting.  Still having LUQ pain.  Pt cannot tolerate powder Dr. Barry Dienes gave her to help control her diarrhea.  It is thick and hard to swallow.  Could not identify this on her med list. She scheduled her own appointment with [facility] for 01/12/14, but cannot remember with whom.  Will call to confirm this.  CT scan and notes to be faxed to [facility].  Please advise regarding the diarrhea medication.

## 2014-01-05 MED ORDER — DIPHENOXYLATE-ATROPINE 2.5-0.025 MG PO TABS: ORAL_TABLET | ORAL | Status: DC

## 2014-01-05 NOTE — Telephone Encounter (Signed)
Called and spoke to patient to make aware that Dr. Barry Dienes has prescribed Lomotil for diarrhea.  Patient aware this medication cannot be called into the pharmacy and will have to pick up at the front desk.  Prescription will be at the front desk ready for patient to pick up.

## 2014-01-05 NOTE — Addendum Note (Signed)
Addended by: Ivor Costa on: 01/05/2014 09:20 AM   Modules accepted: Orders

## 2014-01-26 ENCOUNTER — Other Ambulatory Visit: Payer: Self-pay | Admitting: Gastroenterology

## 2014-02-01 ENCOUNTER — Other Ambulatory Visit: Payer: Self-pay | Admitting: Family Medicine

## 2014-02-01 DIAGNOSIS — F411 Generalized anxiety disorder: Secondary | ICD-10-CM

## 2014-02-03 ENCOUNTER — Encounter: Payer: Self-pay | Admitting: General Surgery

## 2014-02-04 ENCOUNTER — Other Ambulatory Visit: Payer: Self-pay | Admitting: Sports Medicine

## 2014-02-04 DIAGNOSIS — M542 Cervicalgia: Secondary | ICD-10-CM

## 2014-02-04 DIAGNOSIS — M5412 Radiculopathy, cervical region: Secondary | ICD-10-CM

## 2014-02-08 ENCOUNTER — Other Ambulatory Visit (INDEPENDENT_AMBULATORY_CARE_PROVIDER_SITE_OTHER): Payer: Self-pay | Admitting: General Surgery

## 2014-02-08 DIAGNOSIS — C50911 Malignant neoplasm of unspecified site of right female breast: Secondary | ICD-10-CM

## 2014-02-14 ENCOUNTER — Ambulatory Visit
Admission: RE | Admit: 2014-02-14 | Discharge: 2014-02-14 | Disposition: A | Discharge status: Home / Self Care | Payer: 59 | Source: Ambulatory Visit | Attending: Sports Medicine | Admitting: Sports Medicine

## 2014-02-14 DIAGNOSIS — M542 Cervicalgia: Secondary | ICD-10-CM

## 2014-02-14 DIAGNOSIS — M5412 Radiculopathy, cervical region: Secondary | ICD-10-CM

## 2014-03-04 ENCOUNTER — Encounter: Payer: Self-pay | Admitting: Family Medicine

## 2014-03-19 ENCOUNTER — Other Ambulatory Visit: Payer: Self-pay | Admitting: Neurosurgery

## 2014-03-19 DIAGNOSIS — M546 Pain in thoracic spine: Secondary | ICD-10-CM

## 2014-03-28 ENCOUNTER — Ambulatory Visit
Admission: RE | Admit: 2014-03-28 | Discharge: 2014-03-28 | Disposition: A | Discharge status: Home / Self Care | Payer: 59 | Source: Ambulatory Visit | Attending: Neurosurgery | Admitting: Neurosurgery

## 2014-03-28 DIAGNOSIS — M546 Pain in thoracic spine: Secondary | ICD-10-CM

## 2014-04-27 ENCOUNTER — Encounter (HOSPITAL_COMMUNITY): Payer: Self-pay | Admitting: *Deleted

## 2014-05-03 NOTE — H&P (Signed)
33yo G2P2 who presents for hysteroscopy, D&C, Novasure ablation due to abnormal uterine bleeding. Over the past couple of months, her menses have become irregular with not only heavier periods, but the bleeding is ocurring sometimes twice per month. She has just recently started spotting a few days ago and expects a full period in the upcoming days. In review, she has always had heavy periods, but for the past two months her menses have become even heavier sometimes having to change her pad every 15-77min with passage of plum-sized clots. Also, bleeding has become prolonged last for over 2 weeks and in October she had two full 7day periods. Pt had tubal ligation 32 years ago for contraception. Management options were reviewed with patient and she has elected to proceed with surgical management.   Current Medications  Taking   Zofran(Ondansetron HCl) Tablet 1 tablet twice a day as needed   Lomotil(Diphenoxylate-Atropine) 2.5-0.025 MG Tablet 1 tablet three times a day as needed   Cholestyramine 4 GM/DOSE Powder 1 Twice a day   Oxycodone-Acetaminophen 10-325 MG Tablet 1 tablet as needed every 6 hrs   Medication List reviewed and reconciled with the patient    Past Medical History  Sleep apnea  Anxiety   Surgical History  gall bladder removed 32/01/7352- complicated by cervical fracture  tubal ligation   wisdom teeth extraction   gum surgery   plantar wart   hemorrhoidectomy   EGD 01/2014   Family History  Father: alive  Mother: alive, diagnosed with HTN  neg gi family hx, denies any GYN family cancer hx.   Social History  General:  Tobacco use  cigarettes: Current smoker Frequency: 1/2 PPD Smoking: yes.  Alcohol: yes, social, wine, beer.  no Recreational drug use.    Gyn History  Sexual activity currently sexually active.  Periods : irregular.  LMP 04/15/14.  Birth control BTL.  Last pap smear date about a year ago .  Denies H/O Last mammogram date.  Denies H/O Abnormal  pap smear.    OB History  Pregnancy # 1 live birth, vaginal delivery.  Pregnancy # 2 live birth, vaginal delivery.    Allergies  Penicillin: hives and rash   ROS: CONSTITUTIONAL:  no Chills. no Fever. no Skin rash.  HEENT:  Blurrred vision no.  CARDIOLOGY:  no Chest pain.  RESPIRATORY:  no Shortness of breath. no Cough.  GASTROENTEROLOGY:  no Abdominal pain. no Appetite change. no Change in bowel movements.  UROLOGY:  no Urinary frequency. no Urinary incontinence. no Urinary urgency.  FEMALE REPRODUCTIVE:  no Breast lumps or discharge. no Breast pain.  NEUROLOGY:  no Dizziness. no Headache.     O: Examination performed 32/14/15 Vital Signs  Wt 178, Wt change -4 lb, Ht 67, BMI 27.88, Temp 98.2, Pulse sitting 73, BP sitting 114/80.   Examination  General Examination: GENERAL APPEARANCE alert, oriented, NAD, pleasant.  SKIN: normal, no rash.  LUNGS: clear to auscultation bilaterally, no wheezes, rhonchi, rales.  HEART: no murmurs, regular rate and rhythm.  ABDOMEN: no masses palpated, soft and not tender, no rebound, no guarding.  FEMALE GENITOURINARY: No external lesions, Vagina - pink moist mucosa, no lesions or abnormal discharge, cervix - closed, no discharge or lesions. No CMT. No adnexal masses bilaterally. Uterus: nontender and normal size on palpation.  EXTREMITIES: no edema present, no calf tenderness bilaterally   A/P: 33yo G2P2 who presents for hysteroscopy, D&C, Novasure ablation due to abnormal uterine bleeding. -NPO -LR @ 125cc/hr -SCDs to OR -Ancef 2g  IV to OR -Risk, benefit and indications reviewed including risk of bleeding, cramping and injury to other organs. Question and concerns were addressed and patient wishes to proceed with procedure.  Janyth Pupa, DO [phone] (pager) [phone] (office)

## 2014-05-04 MED ORDER — GENTAMICIN SULFATE 40 MG/ML IJ SOLN
INTRAVENOUS | Status: AC
  Administered 2014-05-05: 100 mL via INTRAVENOUS
  Filled 2014-05-04: qty 8.75

## 2014-05-04 MED ORDER — CIPROFLOXACIN IN D5W 400 MG/200ML IV SOLN
400.0000 mg | INTRAVENOUS | Status: DC
  Filled 2014-05-04: qty 200

## 2014-05-05 ENCOUNTER — Ambulatory Visit (HOSPITAL_COMMUNITY)
Admission: RE | Admit: 2014-05-05 | Discharge: 2014-05-05 | Disposition: A | Discharge status: Home / Self Care | Payer: 59 | Source: Ambulatory Visit | Attending: Obstetrics & Gynecology | Admitting: Obstetrics & Gynecology

## 2014-05-05 ENCOUNTER — Ambulatory Visit (HOSPITAL_COMMUNITY): Payer: 59 | Admitting: Anesthesiology

## 2014-05-05 ENCOUNTER — Encounter (HOSPITAL_COMMUNITY): Payer: Self-pay | Admitting: Registered Nurse

## 2014-05-05 ENCOUNTER — Encounter (HOSPITAL_COMMUNITY)
Admission: RE | Disposition: A | Discharge status: Home / Self Care | Payer: Self-pay | Source: Ambulatory Visit | Attending: Obstetrics & Gynecology

## 2014-05-05 DIAGNOSIS — N939 Abnormal uterine and vaginal bleeding, unspecified: Secondary | ICD-10-CM | POA: Insufficient documentation

## 2014-05-05 DIAGNOSIS — F17210 Nicotine dependence, cigarettes, uncomplicated: Secondary | ICD-10-CM | POA: Insufficient documentation

## 2014-05-05 DIAGNOSIS — G4733 Obstructive sleep apnea (adult) (pediatric): Secondary | ICD-10-CM | POA: Insufficient documentation

## 2014-05-05 DIAGNOSIS — J45909 Unspecified asthma, uncomplicated: Secondary | ICD-10-CM | POA: Diagnosis not present

## 2014-05-05 DIAGNOSIS — Z791 Long term (current) use of non-steroidal anti-inflammatories (NSAID): Secondary | ICD-10-CM | POA: Diagnosis not present

## 2014-05-05 DIAGNOSIS — D261 Other benign neoplasm of corpus uteri: Secondary | ICD-10-CM | POA: Diagnosis not present

## 2014-05-05 DIAGNOSIS — Z79899 Other long term (current) drug therapy: Secondary | ICD-10-CM | POA: Insufficient documentation

## 2014-05-05 HISTORY — PX: DILITATION & CURRETTAGE/HYSTROSCOPY WITH NOVASURE ABLATION: SHX5568

## 2014-05-05 LAB — CBC
HCT: 40.1 % (ref 36.0–46.0)
Hemoglobin: 13.9 g/dL (ref 12.0–15.0)
MCH: 32.5 pg (ref 26.0–34.0)
MCHC: 34.7 g/dL (ref 30.0–36.0)
MCV: 93.7 fL (ref 78.0–100.0)
PLATELETS: 286 10*3/uL (ref 150–400)
RBC: 4.28 MIL/uL (ref 3.87–5.11)
RDW: 13.1 % (ref 11.5–15.5)
WBC: 7 10*3/uL (ref 4.0–10.5)

## 2014-05-05 LAB — BASIC METABOLIC PANEL
ANION GAP: 8 (ref 5–15)
BUN: 14 mg/dL (ref 6–23)
CO2: 25 mmol/L (ref 19–32)
Calcium: 9.5 mg/dL (ref 8.4–10.5)
Chloride: 105 mEq/L (ref 96–112)
Creatinine, Ser: 0.73 mg/dL (ref 0.50–1.10)
GFR calc non Af Amer: >90 mL/min (ref >90)
Glucose, Bld: 96 mg/dL (ref 70–99)
POTASSIUM: 4.4 mmol/L (ref 3.5–5.1)
Sodium: 138 mmol/L (ref 135–145)

## 2014-05-05 SURGERY — DILATATION & CURETTAGE/HYSTEROSCOPY WITH NOVASURE ABLATION
Anesthesia: General | Site: Vagina

## 2014-05-05 MED ORDER — KETOROLAC TROMETHAMINE 30 MG/ML IJ SOLN
30.0000 mg | Freq: Once | INTRAMUSCULAR | Status: AC | PRN
  Administered 2014-05-05: 30 mg via INTRAVENOUS

## 2014-05-05 MED ORDER — SCOPOLAMINE 1 MG/3DAYS TD PT72
1.0000 | MEDICATED_PATCH | Freq: Once | TRANSDERMAL | Status: DC
  Administered 2014-05-05: 1.5 mg via TRANSDERMAL

## 2014-05-05 MED ORDER — MEPERIDINE HCL 25 MG/ML IJ SOLN: 6.2500 mg | INTRAMUSCULAR | Status: DC | PRN

## 2014-05-05 MED ORDER — LACTATED RINGERS IV SOLN: INTRAVENOUS | Status: DC

## 2014-05-05 MED ORDER — SCOPOLAMINE 1 MG/3DAYS TD PT72
MEDICATED_PATCH | TRANSDERMAL | Status: AC
  Administered 2014-05-05: 1.5 mg via TRANSDERMAL
  Filled 2014-05-05: qty 1

## 2014-05-05 MED ORDER — FENTANYL CITRATE 0.05 MG/ML IJ SOLN
INTRAMUSCULAR | Status: AC
  Administered 2014-05-05: 50 ug via INTRAVENOUS
  Filled 2014-05-05: qty 2

## 2014-05-05 MED ORDER — PROMETHAZINE HCL 25 MG/ML IJ SOLN: 6.2500 mg | INTRAMUSCULAR | Status: DC | PRN

## 2014-05-05 MED ORDER — ACETAMINOPHEN 325 MG PO TABS
ORAL_TABLET | ORAL | Status: AC
  Filled 2014-05-05: qty 2

## 2014-05-05 MED ORDER — ONDANSETRON HCL 4 MG/2ML IJ SOLN
INTRAMUSCULAR | Status: DC | PRN
  Administered 2014-05-05: 4 mg via INTRAVENOUS

## 2014-05-05 MED ORDER — IBUPROFEN 600 MG PO TABS: 600.0000 mg | ORAL_TABLET | Freq: Four times a day (QID) | ORAL | Status: DC | PRN

## 2014-05-05 MED ORDER — MIDAZOLAM HCL 2 MG/2ML IJ SOLN
INTRAMUSCULAR | Status: AC
  Filled 2014-05-05: qty 2

## 2014-05-05 MED ORDER — MIDAZOLAM HCL 2 MG/2ML IJ SOLN: 0.5000 mg | Freq: Once | INTRAMUSCULAR | Status: DC | PRN

## 2014-05-05 MED ORDER — ONDANSETRON HCL 4 MG/2ML IJ SOLN
INTRAMUSCULAR | Status: AC
  Filled 2014-05-05: qty 2

## 2014-05-05 MED ORDER — ACETAMINOPHEN 160 MG/5ML PO SOLN: 325.0000 mg | ORAL | Status: DC | PRN

## 2014-05-05 MED ORDER — FENTANYL CITRATE 0.05 MG/ML IJ SOLN
INTRAMUSCULAR | Status: AC
  Filled 2014-05-05: qty 2

## 2014-05-05 MED ORDER — DEXAMETHASONE SODIUM PHOSPHATE 4 MG/ML IJ SOLN
INTRAMUSCULAR | Status: DC | PRN
  Administered 2014-05-05: 4 mg via INTRAVENOUS

## 2014-05-05 MED ORDER — LIDOCAINE HCL (CARDIAC) 20 MG/ML IV SOLN
INTRAVENOUS | Status: DC | PRN
  Administered 2014-05-05: 60 mg via INTRAVENOUS

## 2014-05-05 MED ORDER — PROPOFOL 10 MG/ML IV EMUL
INTRAVENOUS | Status: AC
  Filled 2014-05-05: qty 20

## 2014-05-05 MED ORDER — FENTANYL CITRATE 0.05 MG/ML IJ SOLN
25.0000 ug | INTRAMUSCULAR | Status: DC | PRN
  Administered 2014-05-05 (×3): 50 ug via INTRAVENOUS

## 2014-05-05 MED ORDER — MIDAZOLAM HCL 5 MG/5ML IJ SOLN
INTRAMUSCULAR | Status: DC | PRN
  Administered 2014-05-05: 2 mg via INTRAVENOUS

## 2014-05-05 MED ORDER — LACTATED RINGERS IV SOLN
INTRAVENOUS | Status: DC
  Administered 2014-05-05: 11:00:00 via INTRAVENOUS

## 2014-05-05 MED ORDER — ACETAMINOPHEN 325 MG PO TABS
325.0000 mg | ORAL_TABLET | ORAL | Status: DC | PRN
  Administered 2014-05-05: 650 mg via ORAL

## 2014-05-05 MED ORDER — LIDOCAINE HCL (CARDIAC) 20 MG/ML IV SOLN
INTRAVENOUS | Status: AC
  Filled 2014-05-05: qty 5

## 2014-05-05 MED ORDER — FENTANYL CITRATE 0.05 MG/ML IJ SOLN
INTRAMUSCULAR | Status: DC | PRN
  Administered 2014-05-05: 100 ug via INTRAVENOUS

## 2014-05-05 MED ORDER — PROPOFOL 10 MG/ML IV BOLUS
INTRAVENOUS | Status: DC | PRN
  Administered 2014-05-05: 180 mg via INTRAVENOUS
  Administered 2014-05-05: 20 mg via INTRAVENOUS

## 2014-05-05 MED ORDER — DEXAMETHASONE SODIUM PHOSPHATE 4 MG/ML IJ SOLN
INTRAMUSCULAR | Status: AC
  Filled 2014-05-05: qty 1

## 2014-05-05 MED ORDER — BUPIVACAINE HCL (PF) 0.25 % IJ SOLN
INTRAMUSCULAR | Status: DC | PRN
  Administered 2014-05-05: 20 mL

## 2014-05-05 MED ORDER — KETOROLAC TROMETHAMINE 30 MG/ML IJ SOLN
INTRAMUSCULAR | Status: AC
  Filled 2014-05-05: qty 1

## 2014-05-05 SURGICAL SUPPLY — 16 items
ABLATOR ENDOMETRIAL BIPOLAR (ABLATOR) ×3 IMPLANT
CANISTER SUCT 3000ML (MISCELLANEOUS) ×3 IMPLANT
CATH ROBINSON RED A/P 16FR (CATHETERS) ×3 IMPLANT
CLOTH BEACON ORANGE TIMEOUT ST (SAFETY) ×3 IMPLANT
CONTAINER PREFILL 10% NBF 60ML (FORM) ×6 IMPLANT
DILATOR CANAL MILEX (MISCELLANEOUS) IMPLANT
GLOVE BIOGEL PI IND STRL 6.5 (GLOVE) ×2 IMPLANT
GLOVE BIOGEL PI INDICATOR 6.5 (GLOVE) ×4
GLOVE ECLIPSE 6.5 STRL STRAW (GLOVE) ×3 IMPLANT
GOWN STRL REUS W/TWL LRG LVL3 (GOWN DISPOSABLE) ×6 IMPLANT
PACK VAGINAL MINOR WOMEN LF (CUSTOM PROCEDURE TRAY) ×3 IMPLANT
PAD OB MATERNITY 4.3X12.25 (PERSONAL CARE ITEMS) ×3 IMPLANT
TOWEL OR 17X24 6PK STRL BLUE (TOWEL DISPOSABLE) ×6 IMPLANT
TUBING AQUILEX INFLOW (TUBING) ×3 IMPLANT
TUBING AQUILEX OUTFLOW (TUBING) ×3 IMPLANT
WATER STERILE IRR 1000ML POUR (IV SOLUTION) ×3 IMPLANT

## 2014-05-05 NOTE — Interval H&P Note (Signed)
History and Physical Interval Note:  05/05/2014 11:02 AM  Taylor Huff  has presented today for surgery, with the diagnosis of  Abnormal Uterine Bleeding  The various methods of treatment have been discussed with the patient and family. After consideration of risks, benefits and other options for treatment, the patient has consented to  Procedure(s): [place] (N/A) as a surgical intervention .  The patient's history has been reviewed, patient examined, no change in status, stable for surgery.  I have reviewed the patient's chart and labs.  Questions were answered to the patient's satisfaction.     Janyth Pupa, M

## 2014-05-05 NOTE — Progress Notes (Signed)
BMP currently in process, ok to proceed with case prior to final results.  Reviewed with anesthesia who agrees with above.  Janyth Pupa, DO [phone] (pager) [phone] (office)

## 2014-05-05 NOTE — Transfer of Care (Signed)
Immediate Anesthesia Transfer of Care Note  Patient: Taylor Huff  Procedure(s) Performed: Procedure(s): DILATATION & CURETTAGE/HYSTEROSCOPY WITH NOVASURE ABLATION (N/A)  Patient Location: PACU  Anesthesia Type:General  Level of Consciousness: awake, alert  and oriented  Airway & Oxygen Therapy: Patient Spontanous Breathing and Patient connected to nasal cannula oxygen  Post-op Assessment: Report given to PACU RN and Post -op Vital signs reviewed and stable  Post vital signs: Reviewed and stable  Complications: No apparent anesthesia complications

## 2014-05-05 NOTE — Discharge Instructions (Addendum)
HOME INSTRUCTIONS  Please note any unusual or excessive bleeding, pain, swelling. Mild dizziness or drowsiness are normal for about 24 hours after surgery.   Shower when comfortable  Restrictions: No driving for 24 hours or while taking pain medications.  Activity:  No heavy lifting (> 10 lbs), nothing in vagina (no tampons, douching, or intercourse) x 2 weeks; no tub baths for 2 weeks Vaginal spotting is expected but if your bleeding is heavy, period like,  please call the office    Diet:  You may eat whatever you want.  Do not eat large meals.  Eat small frequent meals throughout the day.  Continue to drink a good amount of water at least 6-8 glasses of water per day, hydration is very important for the healing process.  Pain Management: Take Motrin and/or Tylenol as needed for pain.  Always take prescription pain medication with food, it may cause constipation, increase fluids and fiber and you may want to take an over-the-counter stool softener like Colace as needed up to 2x a day.    Alcohol -- Avoid for 24 hours and while taking pain medications.  Nausea: Take sips of ginger ale or soda  Fever -- Call physician if temperature over 101 degrees  Follow up:  If you do not already have a follow up appointment scheduled, please call the office at [phone].  If you experience fever (a temperature greater than 100.4), pain unrelieved by pain medication, shortness of breath, swelling of a single leg, or any other symptoms which are concerning to you please the office immediately.

## 2014-05-05 NOTE — Anesthesia Preprocedure Evaluation (Signed)
Anesthesia Evaluation  Patient identified by MRN, date of birth, ID band Patient awake    Reviewed: Allergy & Precautions, H&P , Patient's Chart, lab work & pertinent test results, reviewed documented beta blocker date and time   History of Anesthesia Complications (+) history of anesthetic complications  Airway Mallampati: II  TM Distance: >3 FB Neck ROM: full    Dental   Pulmonary asthma , Current Smoker,  breath sounds clear to auscultation        Cardiovascular Exercise Tolerance: Good Rhythm:regular Rate:Normal     Neuro/Psych negative psych ROS   GI/Hepatic   Endo/Other    Renal/GU      Musculoskeletal   Abdominal   Peds  Hematology   Anesthesia Other Findings Per OZAN, 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, but would prefer GA with LMA over spinal  OSA- no CPAP since 2007  Reproductive/Obstetrics                             Anesthesia Physical Anesthesia Plan  ASA: II  Anesthesia Plan: General LMA   Post-op Pain Management:    Induction:   Airway Management Planned:   Additional Equipment:   Intra-op Plan:   Post-operative Plan:   Informed Consent: I have reviewed the patients History and Physical, chart, labs and discussed the procedure including the risks, benefits and alternatives for the proposed anesthesia with the patient or authorized representative who has indicated his/her understanding and acceptance.   Dental Advisory Given  Plan Discussed with: CRNA, Surgeon and Anesthesiologist  Anesthesia Plan Comments:         Anesthesia Quick Evaluation

## 2014-05-05 NOTE — Anesthesia Postprocedure Evaluation (Signed)
  Anesthesia Post Note  Patient: Taylor Huff  Procedure(s) Performed: Procedure(s) (LRB): DILATATION & CURETTAGE/HYSTEROSCOPY WITH NOVASURE ABLATION (N/A)  Anesthesia type: GA  Patient location: PACU  Post pain: Pain level controlled  Post assessment: Post-op Vital signs reviewed  Last Vitals:  Filed Vitals:   05/05/14 1225  BP: 107/76  Pulse: 82  Temp:   Resp: 14    Post vital signs: Reviewed  Level of consciousness: sedated  Complications: No apparent anesthesia complications

## 2014-05-05 NOTE — Op Note (Signed)
Operative Report  PreOp: Abnormal uterine bleeding PostOp: same Procedure:  Hysteroscopy, Dilation and Curettage, Novasure Endometrial ablation Surgeon: Dr. Janyth Pupa Anesthesia: General Complications:none EBL: Minimal UOP: 75cc IVF:600cc Discrepency: 47cc  Findings: 8wk sized retroverted uterus with proliferative endometrium, both ostia visualized.  Specimens: 1) ECC 2) EMB  Procedure: The patient was taken to the operating room where she underwent general anesthesia without difficulty. The patient was placed in a low lithotomy position using Allen stirrups. The patient was examined with the findings as noted above.  She was then prepped and draped in the normal sterile fashion. The bladder was drained using a red rubber urethral catheter. A sterile speculum was inserted into the vagina. A single tooth tenaculum was placed on the anterior lip of the cervix. The uterus was then sounded to 8. The endocervical canal was already slightly dilated and no mechanical dilation was needed.  The diagnostic hysteroscope was then inserted without difficulty and noted to have the findings as listed above. Using the hysteroscope, the cervical length was noted to be 4. Visualization was achieved using LR as a distending medium. The hysteroscope was removed and sharp curettage was performed. The tissue was sent to pathology.   Attention was then turned to the Novasure. The Novasure was set up according to manufacture instructions. The cavity length was set to 4. The Novasure was inserted, seating test performed and the cavity width was noted to be 2.7. Cavity assessment was performed and passed. The device was then activated for 92 at a power level of 90sec. Upon completion, the Novasure was removed and the hysteroscope was reinserted. Global ablation was visualized and no uterine perforation was seen. All instrument were then removed. Hemostasis was observed at the cervical site.  The patient was  repositioned to the supine position. The patient tolerated the procedure without any complications and taken to recovery in stable condition.   Janyth Pupa, DO [phone] (pager) [phone] (office)

## 2014-05-07 ENCOUNTER — Encounter (HOSPITAL_COMMUNITY): Payer: Self-pay | Admitting: Obstetrics & Gynecology

## 2014-05-11 ENCOUNTER — Encounter (HOSPITAL_COMMUNITY): Payer: Self-pay | Admitting: Emergency Medicine

## 2014-05-11 ENCOUNTER — Emergency Department (HOSPITAL_COMMUNITY)
Admission: EM | Admit: 2014-05-11 | Discharge: 2014-05-11 | Disposition: A | Discharge status: Home / Self Care | Payer: 59 | Attending: Emergency Medicine | Admitting: Emergency Medicine

## 2014-05-11 ENCOUNTER — Emergency Department (HOSPITAL_COMMUNITY): Payer: 59

## 2014-05-11 DIAGNOSIS — Z3202 Encounter for pregnancy test, result negative: Secondary | ICD-10-CM | POA: Diagnosis not present

## 2014-05-11 DIAGNOSIS — R1900 Intra-abdominal and pelvic swelling, mass and lump, unspecified site: Secondary | ICD-10-CM | POA: Insufficient documentation

## 2014-05-11 DIAGNOSIS — F419 Anxiety disorder, unspecified: Secondary | ICD-10-CM | POA: Diagnosis not present

## 2014-05-11 DIAGNOSIS — Z880 Allergy status to penicillin: Secondary | ICD-10-CM | POA: Insufficient documentation

## 2014-05-11 DIAGNOSIS — Z8669 Personal history of other diseases of the nervous system and sense organs: Secondary | ICD-10-CM | POA: Diagnosis not present

## 2014-05-11 DIAGNOSIS — Z720 Tobacco use: Secondary | ICD-10-CM | POA: Diagnosis not present

## 2014-05-11 DIAGNOSIS — Z9049 Acquired absence of other specified parts of digestive tract: Secondary | ICD-10-CM | POA: Insufficient documentation

## 2014-05-11 DIAGNOSIS — K259 Gastric ulcer, unspecified as acute or chronic, without hemorrhage or perforation: Secondary | ICD-10-CM | POA: Diagnosis not present

## 2014-05-11 DIAGNOSIS — R011 Cardiac murmur, unspecified: Secondary | ICD-10-CM | POA: Diagnosis not present

## 2014-05-11 DIAGNOSIS — K429 Umbilical hernia without obstruction or gangrene: Secondary | ICD-10-CM | POA: Diagnosis not present

## 2014-05-11 DIAGNOSIS — R109 Unspecified abdominal pain: Secondary | ICD-10-CM | POA: Diagnosis present

## 2014-05-11 DIAGNOSIS — Z9851 Tubal ligation status: Secondary | ICD-10-CM | POA: Insufficient documentation

## 2014-05-11 DIAGNOSIS — J45909 Unspecified asthma, uncomplicated: Secondary | ICD-10-CM | POA: Insufficient documentation

## 2014-05-11 LAB — CBC WITH DIFFERENTIAL/PLATELET
Basophils Absolute: 0 10*3/uL (ref 0.0–0.1)
Basophils Relative: 0 % (ref 0–1)
Eosinophils Absolute: 0.1 10*3/uL (ref 0.0–0.7)
Eosinophils Relative: 1 % (ref 0–5)
HCT: 41.1 % (ref 36.0–46.0)
HEMOGLOBIN: 13.9 g/dL (ref 12.0–15.0)
Lymphocytes Relative: 22 % (ref 12–46)
Lymphs Abs: 2.4 10*3/uL (ref 0.7–4.0)
MCH: 31.4 pg (ref 26.0–34.0)
MCHC: 33.8 g/dL (ref 30.0–36.0)
MCV: 93 fL (ref 78.0–100.0)
MONOS PCT: 6 % (ref 3–12)
Monocytes Absolute: 0.6 10*3/uL (ref 0.1–1.0)
NEUTROS ABS: 7.8 10*3/uL — AB (ref 1.7–7.7)
NEUTROS PCT: 71 % (ref 43–77)
Platelets: 261 10*3/uL (ref 150–400)
RBC: 4.42 MIL/uL (ref 3.87–5.11)
RDW: 13.1 % (ref 11.5–15.5)
WBC: 10.9 10*3/uL — ABNORMAL HIGH (ref 4.0–10.5)

## 2014-05-11 LAB — URINALYSIS, ROUTINE W REFLEX MICROSCOPIC
Bilirubin Urine: NEGATIVE
Glucose, UA: NEGATIVE mg/dL
Hgb urine dipstick: NEGATIVE
Ketones, ur: 15 mg/dL — AB
Leukocytes, UA: NEGATIVE
Nitrite: NEGATIVE
Protein, ur: NEGATIVE mg/dL
Specific Gravity, Urine: 1.016 (ref 1.005–1.030)
Urobilinogen, UA: 0.2 mg/dL (ref 0.0–1.0)
pH: 6.5 (ref 5.0–8.0)

## 2014-05-11 LAB — COMPREHENSIVE METABOLIC PANEL
ALT: 28 U/L (ref 0–35)
AST: 26 U/L (ref 0–37)
Albumin: 4.4 g/dL (ref 3.5–5.2)
Alkaline Phosphatase: 49 U/L (ref 39–117)
Anion gap: 9 (ref 5–15)
BUN: 10 mg/dL (ref 6–23)
CO2: 21 mmol/L (ref 19–32)
Calcium: 9.4 mg/dL (ref 8.4–10.5)
Chloride: 107 mEq/L (ref 96–112)
Creatinine, Ser: 0.79 mg/dL (ref 0.50–1.10)
GFR calc Af Amer: >90 mL/min (ref >90)
GFR calc non Af Amer: >90 mL/min (ref >90)
Glucose, Bld: 87 mg/dL (ref 70–99)
Potassium: 4 mmol/L (ref 3.5–5.1)
Sodium: 137 mmol/L (ref 135–145)
Total Bilirubin: 0.5 mg/dL (ref 0.3–1.2)
Total Protein: 7.1 g/dL (ref 6.0–8.3)

## 2014-05-11 LAB — PREGNANCY, URINE: Preg Test, Ur: NEGATIVE

## 2014-05-11 NOTE — Discharge Instructions (Signed)

## 2014-05-11 NOTE — ED Notes (Signed)
Pt c/o abd hernia that is new to pt after pt sts she bent over and felt pop; pt had gall bladder sx and Irving Copas sure sx this year

## 2014-05-11 NOTE — ED Provider Notes (Signed)
CSN: [id number]     Arrival date & time 05/11/14  1210 History   First MD Initiated Contact with Patient 05/11/14 [phone]     Chief Complaint  Patient presents with  . Abdominal Pain     (Consider location/radiation/quality/duration/timing/severity/associated sxs/prior Treatment) HPI Patient presents with abdominal mass that she noticed when bending forward in the shower. He states the mass is soft and is reducible. She has mild abdominal cramping at this time. She denies any nausea, vomiting or diarrhea. She's had no fever or chills. Recent novosure ablation by her gynecologist. No complications. Denies vaginal bleeding or discharge.  Past Medical History  Diagnosis Date  . Anxiety   . Heart murmur   . Sleep apnea     hx of in 2007 with cpap no machine now   . Asthma   . History of stomach ulcers    Past Surgical History  Procedure Laterality Date  . Tubal ligation    . Hemmorhoids    . Plantar wart removal     . Cholecystectomy N/A 11/12/2013    Procedure: LAPAROSCOPIC CHOLECYSTECTOMY POSSIBLE IOC;  Surgeon: Stark Klein, MD;  Location: [facility];  Service: General;  Laterality: N/A;  . Dilitation & currettage/hystroscopy with novasure ablation N/A 05/05/2014    Procedure: DILATATION & CURETTAGE/HYSTEROSCOPY WITH NOVASURE ABLATION;  Surgeon: Annalee Genta, DO;  Location: [facility];  Service: Gynecology;  Laterality: N/A;   Family History  Problem Relation Age of Onset  . Hypertension Mother    History  Substance Use Topics  . Smoking status: Current Every Day Smoker -- 0.50 packs/day for 10 years    Types: Cigarettes  . Smokeless tobacco: Never Used  . Alcohol Use: Yes     Comment: socially    OB History    No data available     Review of Systems  Constitutional: Negative for fever and chills.  Respiratory: Negative for cough and shortness of breath.   Cardiovascular: Negative for chest pain.  Gastrointestinal: Positive for abdominal pain. Negative for nausea, vomiting,  diarrhea and constipation.  Genitourinary: Negative for vaginal bleeding, vaginal discharge and pelvic pain.  Musculoskeletal: Negative for myalgias, back pain, neck pain and neck stiffness.  Skin: Negative for rash and wound.  Neurological: Negative for dizziness, weakness, light-headedness, numbness and headaches.  All other systems reviewed and are negative.     Allergies  Penicillins  Home Medications   Prior to Admission medications   Medication Sig Start Date End Date Taking? Authorizing Provider  acetaminophen (TYLENOL) 500 MG tablet Take 1,000 mg by mouth every 6 (six) hours as needed for moderate pain.   Yes Historical Provider, MD  diphenhydrAMINE (BENADRYL) 25 MG tablet Take 25 mg by mouth every 8 (eight) hours as needed for allergies.    Yes Historical Provider, MD  Doxylamine-DM & DM (VICKS DAYQUIL/NYQUIL COUGH) 6.25-15 & 15 MG/15ML LQPK Take 30 mLs by mouth daily as needed (for cold).   Yes Historical Provider, MD  ALPRAZolam (XANAX) 1 MG tablet TAKE 1/2 TABLET UP TO TWICE A DAY AS NEEDED ANXIETY AND SLEEP Patient not taking: Reported on 04/26/2014 02/01/14   Gay Filler Copland, MD  cholestyramine light (PREVALITE) 4 G packet Take 1 packet (4 g total) by mouth 3 (three) times daily. Patient not taking: Reported on 04/26/2014 12/25/13   Stark Klein, MD  diphenoxylate-atropine (LOMOTIL) 2.5-0.025 MG per tablet Take 1-2 capsules by mouth three (3) times a day as needed for diarrhea. 01/05/14   Stark Klein, MD  ibuprofen (ADVIL,MOTRIN) 600 MG tablet Take 1 tablet (600 mg total) by mouth every 6 (six) hours as needed. 05/05/14   Annalee Genta, DO  ondansetron (ZOFRAN) 4 MG tablet Take 1 tablet (4 mg total) by mouth every 8 (eight) hours as needed for nausea. 12/25/13   Stark Klein, MD  sucralfate (CARAFATE) 1 G tablet Take 1 tablet (1 g total) by mouth 4 (four) times daily -  with meals and at bedtime. Patient not taking: Reported on 04/26/2014 10/24/13   Gay Filler Copland, MD    BP 112/74 mmHg  Pulse 66  Temp(Src) 98.2 F (36.8 C) (Oral)  Resp 18  SpO2 100%  LMP 04/15/2014 Physical Exam  Constitutional: She is oriented to person, place, and time. She appears well-developed and well-nourished. No distress.  HENT:  Head: Normocephalic and atraumatic.  Mouth/Throat: Oropharynx is clear and moist.  Eyes: EOM are normal. Pupils are equal, round, and reactive to light.  Neck: Normal range of motion. Neck supple.  Cardiovascular: Normal rate and regular rhythm.   Pulmonary/Chest: Effort normal and breath sounds normal. No respiratory distress. She has no wheezes. She has no rales.  Abdominal: Soft. Bowel sounds are normal. She exhibits no distension and no mass. There is tenderness (mild diffuse abdominal tenderness to palpation without focality.). There is no rebound and no guarding.  Abdominal wall defect felt in the umbilical area. No hernia appreciated.   Musculoskeletal: Normal range of motion. She exhibits no edema or tenderness.  No CVA tenderness  Neurological: She is alert and oriented to person, place, and time.  Skin: Skin is warm and dry. No rash noted. No erythema.  Psychiatric: She has a normal mood and affect. Her behavior is normal.  Nursing note and vitals reviewed.   ED Course  Procedures (including critical care time) Labs Review Labs Reviewed  CBC WITH DIFFERENTIAL - Abnormal; Notable for the following:    WBC 10.9 (*)    Neutro Abs 7.8 (*)    All other components within normal limits  URINALYSIS, ROUTINE W REFLEX MICROSCOPIC - Abnormal; Notable for the following:    Ketones, ur 15 (*)    All other components within normal limits  COMPREHENSIVE METABOLIC PANEL  PREGNANCY, URINE    Imaging Review Dg Abd Acute W/chest  05/11/2014   CLINICAL DATA:  33 year old female with acute abdominal pain.  EXAM: ACUTE ABDOMEN SERIES (ABDOMEN 2 VIEW & CHEST 1 VIEW)  COMPARISON:  08/25/2013 and prior radiographs.  01/01/2014 CT  FINDINGS: The  cardiomediastinal silhouette is unremarkable.  Lungs are clear.  There is no evidence of airspace disease, pleural effusion or pneumothorax.  The bowel gas pattern is unremarkable.  There is no evidence of bowel obstruction or pneumoperitoneum.  Cholecystectomy clips are again identified.  No suspicious calcifications are noted.  IMPRESSION: Negative abdominal radiographs.  No acute cardiopulmonary disease.   Electronically Signed   By: Hassan Rowan M.D.   On: 05/11/2014 17:22     EKG Interpretation None      MDM   Final diagnoses:  Abdominal mass  Umbilical hernia without obstruction and without gangrene   Abdominal pain is improved. Abdomen is soft. History and exam consistent with umbilical hernia that is easily reduced. X-ray without any evidence of obstruction. Patient is advised to follow-up with the general surgeon and has been given return precautions.     Julianne Rice, MD 05/11/14 [facility] Emerald

## 2014-07-06 DIAGNOSIS — Z0271 Encounter for disability determination: Secondary | ICD-10-CM

## 2014-08-31 ENCOUNTER — Encounter (HOSPITAL_COMMUNITY): Payer: Self-pay

## 2014-08-31 ENCOUNTER — Emergency Department (HOSPITAL_COMMUNITY)
Admission: EM | Admit: 2014-08-31 | Discharge: 2014-08-31 | Disposition: A | Discharge status: Home / Self Care | Payer: 59 | Attending: Emergency Medicine | Admitting: Emergency Medicine

## 2014-08-31 DIAGNOSIS — K429 Umbilical hernia without obstruction or gangrene: Secondary | ICD-10-CM | POA: Insufficient documentation

## 2014-08-31 DIAGNOSIS — Z79899 Other long term (current) drug therapy: Secondary | ICD-10-CM | POA: Insufficient documentation

## 2014-08-31 DIAGNOSIS — J45909 Unspecified asthma, uncomplicated: Secondary | ICD-10-CM | POA: Insufficient documentation

## 2014-08-31 DIAGNOSIS — Z3202 Encounter for pregnancy test, result negative: Secondary | ICD-10-CM | POA: Diagnosis not present

## 2014-08-31 DIAGNOSIS — R011 Cardiac murmur, unspecified: Secondary | ICD-10-CM | POA: Diagnosis not present

## 2014-08-31 DIAGNOSIS — Z9981 Dependence on supplemental oxygen: Secondary | ICD-10-CM | POA: Diagnosis not present

## 2014-08-31 DIAGNOSIS — Z9049 Acquired absence of other specified parts of digestive tract: Secondary | ICD-10-CM | POA: Insufficient documentation

## 2014-08-31 DIAGNOSIS — G4730 Sleep apnea, unspecified: Secondary | ICD-10-CM | POA: Insufficient documentation

## 2014-08-31 DIAGNOSIS — F419 Anxiety disorder, unspecified: Secondary | ICD-10-CM | POA: Insufficient documentation

## 2014-08-31 DIAGNOSIS — Z9851 Tubal ligation status: Secondary | ICD-10-CM | POA: Diagnosis not present

## 2014-08-31 DIAGNOSIS — Z7951 Long term (current) use of inhaled steroids: Secondary | ICD-10-CM | POA: Insufficient documentation

## 2014-08-31 DIAGNOSIS — Z720 Tobacco use: Secondary | ICD-10-CM | POA: Insufficient documentation

## 2014-08-31 LAB — COMPREHENSIVE METABOLIC PANEL
ALT: 19 U/L (ref 0–35)
AST: 18 U/L (ref 0–37)
Albumin: 4.7 g/dL (ref 3.5–5.2)
Alkaline Phosphatase: 46 U/L (ref 39–117)
Anion gap: 5 (ref 5–15)
BUN: 15 mg/dL (ref 6–23)
CALCIUM: 8.8 mg/dL (ref 8.4–10.5)
CO2: 22 mmol/L (ref 19–32)
Chloride: 109 mmol/L (ref 96–112)
Creatinine, Ser: 0.82 mg/dL (ref 0.50–1.10)
GFR calc non Af Amer: >90 mL/min (ref >90)
GLUCOSE: 89 mg/dL (ref 70–99)
Potassium: 3.9 mmol/L (ref 3.5–5.1)
SODIUM: 136 mmol/L (ref 135–145)
TOTAL PROTEIN: 7.5 g/dL (ref 6.0–8.3)
Total Bilirubin: 0.5 mg/dL (ref 0.3–1.2)

## 2014-08-31 LAB — HCG, SERUM, QUALITATIVE: PREG SERUM: NEGATIVE

## 2014-08-31 LAB — CBC WITH DIFFERENTIAL/PLATELET
Basophils Absolute: 0 10*3/uL (ref 0.0–0.1)
Basophils Relative: 1 % (ref 0–1)
EOS PCT: 2 % (ref 0–5)
Eosinophils Absolute: 0.2 10*3/uL (ref 0.0–0.7)
HEMATOCRIT: 39.2 % (ref 36.0–46.0)
Hemoglobin: 13.2 g/dL (ref 12.0–15.0)
LYMPHS ABS: 2 10*3/uL (ref 0.7–4.0)
LYMPHS PCT: 24 % (ref 12–46)
MCH: 32.1 pg (ref 26.0–34.0)
MCHC: 33.7 g/dL (ref 30.0–36.0)
MCV: 95.4 fL (ref 78.0–100.0)
MONO ABS: 0.7 10*3/uL (ref 0.1–1.0)
Monocytes Relative: 8 % (ref 3–12)
Neutro Abs: 5.6 10*3/uL (ref 1.7–7.7)
Neutrophils Relative %: 65 % (ref 43–77)
Platelets: 262 10*3/uL (ref 150–400)
RBC: 4.11 MIL/uL (ref 3.87–5.11)
RDW: 13 % (ref 11.5–15.5)
WBC: 8.5 10*3/uL (ref 4.0–10.5)

## 2014-08-31 MED ORDER — CIPROFLOXACIN HCL 500 MG PO TABS: 500.0000 mg | ORAL_TABLET | Freq: Two times a day (BID) | ORAL | Status: DC

## 2014-08-31 MED ORDER — FENTANYL CITRATE (PF) 100 MCG/2ML IJ SOLN
100.0000 ug | Freq: Once | INTRAMUSCULAR | Status: AC
  Administered 2014-08-31: 100 ug via INTRAVENOUS
  Filled 2014-08-31: qty 2

## 2014-08-31 MED ORDER — ONDANSETRON HCL 4 MG/2ML IJ SOLN
4.0000 mg | Freq: Once | INTRAMUSCULAR | Status: AC
  Administered 2014-08-31: 4 mg via INTRAVENOUS
  Filled 2014-08-31: qty 2

## 2014-08-31 NOTE — ED Provider Notes (Signed)
CSN: [id number]     Arrival date & time 08/31/14  1121 History   First MD Initiated Contact with Patient 08/31/14 1227     Chief Complaint  Patient presents with  . Hernia      HPI  Pt with umbilical hernia.  History of same,  States she has been told she can't be seen at [facility].  No nausea vomiting or diarrhea.  Past Medical History  Diagnosis Date  . Anxiety   . Heart murmur   . Sleep apnea     hx of in 2007 with cpap no machine now   . Asthma   . History of stomach ulcers    Past Surgical History  Procedure Laterality Date  . Tubal ligation    . Hemmorhoids    . Plantar wart removal     . Cholecystectomy N/A 11/12/2013    Procedure: LAPAROSCOPIC CHOLECYSTECTOMY POSSIBLE IOC;  Surgeon: Stark Klein, MD;  Location: [facility];  Service: General;  Laterality: N/A;  . Dilitation & currettage/hystroscopy with novasure ablation N/A 05/05/2014    Procedure: DILATATION & CURETTAGE/HYSTEROSCOPY WITH NOVASURE ABLATION;  Surgeon: Annalee Genta, DO;  Location: [facility];  Service: Gynecology;  Laterality: N/A;   Family History  Problem Relation Age of Onset  . Hypertension Mother    History  Substance Use Topics  . Smoking status: Current Every Day Smoker -- 0.50 packs/day for 10 years    Types: Cigarettes  . Smokeless tobacco: Never Used  . Alcohol Use: Yes     Comment: socially    OB History    No data available     Review of Systems  Constitutional: Negative for fever, chills, diaphoresis, appetite change and fatigue.  HENT: Negative for mouth sores, sore throat and trouble swallowing.   Eyes: Negative for visual disturbance.  Respiratory: Negative for cough, chest tightness, shortness of breath and wheezing.   Cardiovascular: Negative for chest pain.  Gastrointestinal: Negative for nausea, vomiting, abdominal pain, diarrhea and abdominal distention.       Umbilical hernia   Endocrine: Negative for polydipsia, polyphagia and polyuria.  Genitourinary: Negative for  dysuria, frequency and hematuria.  Musculoskeletal: Negative for gait problem.  Skin: Negative for color change, pallor and rash.  Neurological: Negative for dizziness, syncope, light-headedness and headaches.  Hematological: Does not bruise/bleed easily.  Psychiatric/Behavioral: Negative for behavioral problems and confusion.      Allergies  Penicillins  Home Medications   Prior to Admission medications   Medication Sig Start Date End Date Taking? Authorizing Provider  fluticasone (FLONASE) 50 MCG/ACT nasal spray Place 1 spray into both nostrils daily.   Yes Historical Provider, MD  ibuprofen (ADVIL,MOTRIN) 200 MG tablet Take 600 mg by mouth every 6 (six) hours as needed for moderate pain.   Yes Historical Provider, MD  naproxen sodium (ANAPROX) 220 MG tablet Take 440 mg by mouth daily as needed (neck pain.).   Yes Historical Provider, MD  acetaminophen (TYLENOL) 500 MG tablet Take 1,000 mg by mouth every 6 (six) hours as needed for moderate pain.    Historical Provider, MD  ALPRAZolam Duanne Moron) 1 MG tablet TAKE 1/2 TABLET UP TO TWICE A DAY AS NEEDED ANXIETY AND SLEEP Patient not taking: Reported on 04/26/2014 02/01/14   Gay Filler Copland, MD  cholestyramine light (PREVALITE) 4 G packet Take 1 packet (4 g total) by mouth 3 (three) times daily. Patient not taking: Reported on 04/26/2014 12/25/13   Stark Klein, MD  ciprofloxacin (CIPRO) 500 MG tablet  Take 1 tablet (500 mg total) by mouth 2 (two) times daily. 08/31/14   Tanna Furry, MD  diphenhydrAMINE (BENADRYL) 25 MG tablet Take 25 mg by mouth every 8 (eight) hours as needed for allergies.     Historical Provider, MD  diphenoxylate-atropine (LOMOTIL) 2.5-0.025 MG per tablet Take 1-2 capsules by mouth three (3) times a day as needed for diarrhea. Patient not taking: Reported on 08/31/2014 01/05/14   Stark Klein, MD  Doxylamine-DM & DM (VICKS DAYQUIL/NYQUIL COUGH) 6.25-15 & 15 MG/15ML LQPK Take 30 mLs by mouth daily as needed (for cold).     Historical Provider, MD  ibuprofen (ADVIL,MOTRIN) 600 MG tablet Take 1 tablet (600 mg total) by mouth every 6 (six) hours as needed. Patient not taking: Reported on 08/31/2014 05/05/14   Janyth Pupa, DO  ondansetron (ZOFRAN) 4 MG tablet Take 1 tablet (4 mg total) by mouth every 8 (eight) hours as needed for nausea. Patient not taking: Reported on 08/31/2014 12/25/13   Stark Klein, MD  sucralfate (CARAFATE) 1 G tablet Take 1 tablet (1 g total) by mouth 4 (four) times daily -  with meals and at bedtime. Patient not taking: Reported on 04/26/2014 10/24/13   Gay Filler Copland, MD   BP 109/64 mmHg  Pulse 66  Temp(Src) 98.1 F (36.7 C) (Oral)  Resp 16  SpO2 100% Physical Exam  Constitutional: She is oriented to person, place, and time. She appears well-developed and well-nourished. No distress.  HENT:  Head: Normocephalic.  Eyes: Conjunctivae are normal. Pupils are equal, round, and reactive to light. No scleral icterus.  Neck: Normal range of motion. Neck supple. No thyromegaly present.  Cardiovascular: Normal rate and regular rhythm.  Exam reveals no gallop and no friction rub.   No murmur heard. Pulmonary/Chest: Effort normal and breath sounds normal. No respiratory distress. She has no wheezes. She has no rales.  Abdominal: Soft. Bowel sounds are normal. She exhibits no distension. There is no tenderness. There is no rebound.    Musculoskeletal: Normal range of motion.  Neurological: She is alert and oriented to person, place, and time.  Skin: Skin is warm and dry. No rash noted.  Psychiatric: She has a normal mood and affect. Her behavior is normal.    ED Course  Procedures (including critical care time) Labs Review Labs Reviewed  CBC WITH DIFFERENTIAL/PLATELET  COMPREHENSIVE METABOLIC PANEL  HCG, SERUM, QUALITATIVE    Imaging Review No results found.   EKG Interpretation None      MDM   Final diagnoses:  Umbilical hernia without obstruction and without gangrene     Patient initially will not allow me to examine her abdomen. Does not have obvious swelling in the sclerae. Given IV fentanyl. On reexam she does have a broad-based freely reducible umbilical hernia. Have her stand and this recurs however when she lays back down it would spontaneously resolves. I think she is appropriate for outpatient treatment. Abdominal binder or girdle. Surgical follow-up.    Tanna Furry, MD 08/31/14 [phone]

## 2014-08-31 NOTE — ED Notes (Signed)
Pt has hx of hernia.  Pt hernia came out today.  Unable to get back in place.  Told she would not need surgery unless came out.  She notified [facility] and told to come here.

## 2014-08-31 NOTE — ED Notes (Signed)
Patient was educated not to drive, operate heavy machinery, or drink alcohol while taking narcotic medication.  

## 2014-08-31 NOTE — ED Notes (Signed)
MD at bedside. 

## 2014-08-31 NOTE — Discharge Instructions (Signed)

## 2014-09-03 ENCOUNTER — Other Ambulatory Visit: Payer: Self-pay | Admitting: Surgery

## 2014-09-11 NOTE — Pre-Procedure Instructions (Signed)
Taylor Huff  09/11/2014   Your procedure is scheduled on:  May 5  Report to [facility]mitting at 07:30 AM.  Call this number if you have problems the morning of surgery: [phone]   Remember:   Do not eat food or drink liquids after midnight.   Take these medicines the morning of surgery with A SIP OF WATER: Cipro, Benadryl (if needed), Flonase (if needed), Claritin (if needed),    STOP Ibuprfoen and Naproxen today   STOP/ Do not take Aspirin, Aleve, Naproxen, Advil, Ibuprofen, Motrin, Vitamins, Herbs, or Supplements starting today   Do not wear jewelry, make-up or nail polish.  Do not wear lotions, powders, or perfumes. You may wear deodorant.  Do not shave 48 hours prior to surgery. Men may shave face and neck.  Do not bring valuables to the hospital.  [facility] is not responsible for any belongings or valuables.               Contacts, dentures or bridgework may not be worn into surgery.  Leave suitcase in the car. After surgery it may be brought to your room.  For patients admitted to the hospital, discharge time is determined by your treatment team.               Patients discharged the day of surgery will not be allowed to drive home.  Name and phone number of your driver: Family/ Friend  Special Instructions: [place] - Preparing for Surgery  Before surgery, you can play an important role.  Because skin is not sterile, your skin needs to be as free of germs as possible.  You can reduce the number of germs on you skin by washing with CHG (chlorahexidine gluconate) soap before surgery.  CHG is an antiseptic cleaner which kills germs and bonds with the skin to continue killing germs even after washing.  Please DO NOT use if you have an allergy to CHG or antibacterial soaps.  If your skin becomes reddened/irritated stop using the CHG and inform your nurse when you arrive at Short Stay.  Do not shave (including legs and underarms) for at least 48 hours  prior to the first CHG shower.  You may shave your face.  Please follow these instructions carefully:   1.  Shower with CHG Soap the night before surgery and the morning of Surgery.  2.  If you choose to wash your hair, wash your hair first as usual with your normal shampoo.  3.  After you shampoo, rinse your hair and body thoroughly to remove the shampoo.  4.  Use CHG as you would any other liquid soap.  You can apply CHG directly to the skin and wash gently with scrungie or a clean washcloth.  5.  Apply the CHG Soap to your body ONLY FROM THE NECK DOWN.  Do not use on open wounds or open sores.  Avoid contact with your eyes, ears, mouth and genitals (private parts).  Wash genitals (private parts) with your normal soap.  6.  Wash thoroughly, paying special attention to the area where your surgery will be performed.  7.  Thoroughly rinse your body with warm water from the neck down.  8.  DO NOT shower/wash with your normal soap after using and rinsing off the CHG Soap.  9.  Pat yourself dry with a clean towel.            10.  Wear clean pajamas.  11.  Place clean sheets on your bed the night of your first shower and do not sleep with pets.  Day of Surgery  Do not apply any lotions the morning of surgery.  Please wear clean clothes to the hospital/surgery center.     Please read over the following fact sheets that you were given: Pain Booklet, Coughing and Deep Breathing and Surgical Site Infection Prevention

## 2014-09-13 ENCOUNTER — Encounter (HOSPITAL_COMMUNITY)
Admission: RE | Admit: 2014-09-13 | Discharge: 2014-09-13 | Disposition: A | Discharge status: Home / Self Care | Payer: 59 | Source: Ambulatory Visit | Attending: Surgery | Admitting: Surgery

## 2014-09-13 ENCOUNTER — Encounter (HOSPITAL_COMMUNITY): Payer: Self-pay

## 2014-09-13 DIAGNOSIS — Z9049 Acquired absence of other specified parts of digestive tract: Secondary | ICD-10-CM | POA: Diagnosis not present

## 2014-09-13 DIAGNOSIS — Z880 Allergy status to penicillin: Secondary | ICD-10-CM | POA: Diagnosis not present

## 2014-09-13 DIAGNOSIS — Z9861 Coronary angioplasty status: Secondary | ICD-10-CM | POA: Diagnosis not present

## 2014-09-13 DIAGNOSIS — K432 Incisional hernia without obstruction or gangrene: Secondary | ICD-10-CM | POA: Diagnosis not present

## 2014-09-13 DIAGNOSIS — F419 Anxiety disorder, unspecified: Secondary | ICD-10-CM | POA: Diagnosis not present

## 2014-09-13 DIAGNOSIS — F17210 Nicotine dependence, cigarettes, uncomplicated: Secondary | ICD-10-CM | POA: Diagnosis not present

## 2014-09-13 HISTORY — DX: Adverse effect of unspecified anesthetic, initial encounter: T41.45XA

## 2014-09-13 HISTORY — DX: Personal history of other diseases of the digestive system: Z87.19

## 2014-09-13 HISTORY — DX: Other complications of anesthesia, initial encounter: T88.59XA

## 2014-09-13 LAB — CBC
HCT: 42.3 % (ref 36.0–46.0)
HEMOGLOBIN: 14.3 g/dL (ref 12.0–15.0)
MCH: 32 pg (ref 26.0–34.0)
MCHC: 33.8 g/dL (ref 30.0–36.0)
MCV: 94.6 fL (ref 78.0–100.0)
Platelets: 314 10*3/uL (ref 150–400)
RBC: 4.47 MIL/uL (ref 3.87–5.11)
RDW: 13.1 % (ref 11.5–15.5)
WBC: 6.4 10*3/uL (ref 4.0–10.5)

## 2014-09-13 LAB — BASIC METABOLIC PANEL
ANION GAP: 11 (ref 5–15)
BUN: 14 mg/dL (ref 6–20)
CO2: 21 mmol/L — ABNORMAL LOW (ref 22–32)
Calcium: 9.2 mg/dL (ref 8.9–10.3)
Chloride: 108 mmol/L (ref 101–111)
Creatinine, Ser: 0.89 mg/dL (ref 0.44–1.00)
GFR calc Af Amer: >60 mL/min (ref >60)
Glucose, Bld: 91 mg/dL (ref 70–99)
POTASSIUM: 4.6 mmol/L (ref 3.5–5.1)
Sodium: 140 mmol/L (ref 135–145)

## 2014-09-15 MED ORDER — CIPROFLOXACIN IN D5W 400 MG/200ML IV SOLN
400.0000 mg | INTRAVENOUS | Status: AC
  Administered 2014-09-16: 400 mg via INTRAVENOUS
  Filled 2014-09-15: qty 200

## 2014-09-15 NOTE — H&P (Signed)
  Taylor Huff 09/03/2014 11:00 AM Location: [facility] Patient #: [id number] DOB: October 24, 1980 Single / Language: Taylor Huff / Race: White Female  History of Present Illness ([name] A. Ninfa Linden MD; 09/03/2014 11:29 AM) Patient words: hernia.  The patient is a 34 year old female who presents with an incisional hernia. This is a patient of ours who had undergone a laparoscopic cholecystectomy by Dr. Barry Dienes in July of last year. She has now developed an incisional hernia umbilicus. She has had a difficult time reducing this and even had to go to the emergency department. She had cramping abdominal pain with mild nausea but no emesis.   Allergies Elbert Ewings, CMA; 09/03/2014 11:01 AM) Penicillins  Medication History Elbert Ewings, [place]; 09/03/2014 11:02 AM) Medications Reconciled No Current Medications  Vitals Elbert Ewings CMA; 09/03/2014 11:02 AM) 09/03/2014 11:02 AM Weight: 184 lb Height: 67in Body Surface Area: 1.99 m Body Mass Index: 28.82 kg/m Temp.: 98.50F(Oral)  Pulse: 67 (Regular)  Resp.: 18 (Unlabored)  BP: 128/70 (Sitting, Left Arm, Standard)    Physical Exam (Elieser Tetrick A. Ninfa Linden MD; 09/03/2014 11:29 AM) The physical exam findings are as follows: Note:On exam, her abdomen is soft. There is an incisional hernia at the umbilicus which is difficult to reduce but I could reduce it Lungs clear bilaterally CV regular rate and rhythm Skin with no erythema Otherwise, normal physical exam    Assessment & Plan (Landen Knoedler A. Ninfa Linden MD; 4/31/5400 86:76 AM) UMBILICAL HERNIA (195.0  K42.9) Impression: This is an incisional hernia with a small fascial defect. She needs urgent repair because it keeps coming partially incarcerated. I am worried that she will eventually develop strangulate bowel if the hernia is not fixed urgently. Surgery will be scheduled. She is in agreement.

## 2014-09-16 ENCOUNTER — Ambulatory Visit (HOSPITAL_COMMUNITY)
Admission: RE | Admit: 2014-09-16 | Discharge: 2014-09-16 | Disposition: A | Discharge status: Home / Self Care | Payer: 59 | Source: Ambulatory Visit | Attending: Surgery | Admitting: Surgery

## 2014-09-16 ENCOUNTER — Encounter (HOSPITAL_COMMUNITY)
Admission: RE | Disposition: A | Discharge status: Home / Self Care | Payer: Self-pay | Source: Ambulatory Visit | Attending: Surgery

## 2014-09-16 ENCOUNTER — Encounter (HOSPITAL_COMMUNITY): Payer: Self-pay | Admitting: *Deleted

## 2014-09-16 ENCOUNTER — Ambulatory Visit (HOSPITAL_COMMUNITY): Payer: 59 | Admitting: Certified Registered"

## 2014-09-16 DIAGNOSIS — Z880 Allergy status to penicillin: Secondary | ICD-10-CM | POA: Insufficient documentation

## 2014-09-16 DIAGNOSIS — Z9861 Coronary angioplasty status: Secondary | ICD-10-CM | POA: Insufficient documentation

## 2014-09-16 DIAGNOSIS — Z9049 Acquired absence of other specified parts of digestive tract: Secondary | ICD-10-CM | POA: Insufficient documentation

## 2014-09-16 DIAGNOSIS — K432 Incisional hernia without obstruction or gangrene: Secondary | ICD-10-CM | POA: Diagnosis not present

## 2014-09-16 DIAGNOSIS — F419 Anxiety disorder, unspecified: Secondary | ICD-10-CM | POA: Insufficient documentation

## 2014-09-16 DIAGNOSIS — F17210 Nicotine dependence, cigarettes, uncomplicated: Secondary | ICD-10-CM | POA: Insufficient documentation

## 2014-09-16 HISTORY — PX: UMBILICAL HERNIA REPAIR: SHX196

## 2014-09-16 HISTORY — PX: INSERTION OF MESH: SHX5868

## 2014-09-16 SURGERY — REPAIR, HERNIA, UMBILICAL, ADULT
Anesthesia: General | Site: Abdomen

## 2014-09-16 MED ORDER — MIDAZOLAM HCL 5 MG/5ML IJ SOLN
INTRAMUSCULAR | Status: DC | PRN
  Administered 2014-09-16: 2 mg via INTRAVENOUS

## 2014-09-16 MED ORDER — MORPHINE SULFATE 4 MG/ML IJ SOLN
INTRAMUSCULAR | Status: AC
  Administered 2014-09-16: 4 mg
  Filled 2014-09-16: qty 1

## 2014-09-16 MED ORDER — FENTANYL CITRATE (PF) 250 MCG/5ML IJ SOLN
INTRAMUSCULAR | Status: AC
  Filled 2014-09-16: qty 5

## 2014-09-16 MED ORDER — HYDROMORPHONE HCL 1 MG/ML IJ SOLN
INTRAMUSCULAR | Status: AC
  Filled 2014-09-16: qty 1

## 2014-09-16 MED ORDER — LACTATED RINGERS IV SOLN
INTRAVENOUS | Status: DC
  Administered 2014-09-16: 08:00:00 via INTRAVENOUS

## 2014-09-16 MED ORDER — PROPOFOL 10 MG/ML IV BOLUS
INTRAVENOUS | Status: DC | PRN
Start: 2014-09-16 — End: 2014-09-16
  Administered 2014-09-16: 160 mg via INTRAVENOUS

## 2014-09-16 MED ORDER — ONDANSETRON HCL 4 MG/2ML IJ SOLN
INTRAMUSCULAR | Status: AC
Start: 2014-09-16 — End: 2014-09-16
  Administered 2014-09-16: 4 mg via INTRAVENOUS
  Filled 2014-09-16: qty 2

## 2014-09-16 MED ORDER — MEPERIDINE HCL 25 MG/ML IJ SOLN: 6.2500 mg | INTRAMUSCULAR | Status: DC | PRN

## 2014-09-16 MED ORDER — LIDOCAINE HCL (CARDIAC) 20 MG/ML IV SOLN
INTRAVENOUS | Status: DC | PRN
  Administered 2014-09-16: 100 mg via INTRAVENOUS

## 2014-09-16 MED ORDER — KETOROLAC TROMETHAMINE 30 MG/ML IJ SOLN
INTRAMUSCULAR | Status: AC
  Filled 2014-09-16: qty 1

## 2014-09-16 MED ORDER — EPHEDRINE SULFATE 50 MG/ML IJ SOLN
INTRAMUSCULAR | Status: AC
  Filled 2014-09-16: qty 1

## 2014-09-16 MED ORDER — ROCURONIUM BROMIDE 50 MG/5ML IV SOLN
INTRAVENOUS | Status: AC
  Filled 2014-09-16: qty 1

## 2014-09-16 MED ORDER — ACETAMINOPHEN 325 MG PO TABS
650.0000 mg | ORAL_TABLET | ORAL | Status: DC | PRN
  Filled 2014-09-16: qty 2

## 2014-09-16 MED ORDER — HYDROMORPHONE HCL 1 MG/ML IJ SOLN
0.2500 mg | INTRAMUSCULAR | Status: DC | PRN
  Administered 2014-09-16 (×4): 0.5 mg via INTRAVENOUS

## 2014-09-16 MED ORDER — OXYCODONE-ACETAMINOPHEN 5-325 MG PO TABS: 1.0000 | ORAL_TABLET | ORAL | Status: DC | PRN

## 2014-09-16 MED ORDER — HYDROMORPHONE HCL 1 MG/ML IJ SOLN
0.5000 mg | INTRAMUSCULAR | Status: DC | PRN
  Administered 2014-09-16: 1 mg via INTRAVENOUS

## 2014-09-16 MED ORDER — PROPOFOL 10 MG/ML IV BOLUS
INTRAVENOUS | Status: AC
  Filled 2014-09-16: qty 20

## 2014-09-16 MED ORDER — ACETAMINOPHEN 650 MG RE SUPP
650.0000 mg | RECTAL | Status: DC | PRN
  Filled 2014-09-16: qty 1

## 2014-09-16 MED ORDER — ONDANSETRON HCL 4 MG/2ML IJ SOLN
INTRAMUSCULAR | Status: DC | PRN
Start: 2014-09-16 — End: 2014-09-16
  Administered 2014-09-16: 4 mg via INTRAVENOUS

## 2014-09-16 MED ORDER — SUCCINYLCHOLINE CHLORIDE 20 MG/ML IJ SOLN
INTRAMUSCULAR | Status: AC
  Filled 2014-09-16: qty 1

## 2014-09-16 MED ORDER — ARTIFICIAL TEARS OP OINT
TOPICAL_OINTMENT | OPHTHALMIC | Status: AC
  Filled 2014-09-16: qty 3.5

## 2014-09-16 MED ORDER — OXYCODONE HCL 5 MG PO TABS
ORAL_TABLET | ORAL | Status: AC
  Filled 2014-09-16: qty 1

## 2014-09-16 MED ORDER — OXYCODONE HCL 5 MG PO TABS
5.0000 mg | ORAL_TABLET | ORAL | Status: DC | PRN
  Administered 2014-09-16: 5 mg via ORAL

## 2014-09-16 MED ORDER — KETOROLAC TROMETHAMINE 30 MG/ML IJ SOLN
INTRAMUSCULAR | Status: DC | PRN
  Administered 2014-09-16: 30 mg via INTRAVENOUS

## 2014-09-16 MED ORDER — BUPIVACAINE HCL (PF) 0.25 % IJ SOLN
INTRAMUSCULAR | Status: DC | PRN
  Administered 2014-09-16: 16 mL

## 2014-09-16 MED ORDER — MIDAZOLAM HCL 2 MG/2ML IJ SOLN
INTRAMUSCULAR | Status: AC
  Filled 2014-09-16: qty 2

## 2014-09-16 MED ORDER — ARTIFICIAL TEARS OP OINT
TOPICAL_OINTMENT | OPHTHALMIC | Status: DC | PRN
  Administered 2014-09-16: 1 via OPHTHALMIC

## 2014-09-16 MED ORDER — LIDOCAINE HCL (CARDIAC) 20 MG/ML IV SOLN
INTRAVENOUS | Status: AC
  Filled 2014-09-16: qty 5

## 2014-09-16 MED ORDER — FENTANYL CITRATE (PF) 100 MCG/2ML IJ SOLN
INTRAMUSCULAR | Status: DC | PRN
  Administered 2014-09-16: 50 ug via INTRAVENOUS
  Administered 2014-09-16 (×2): 100 ug via INTRAVENOUS

## 2014-09-16 MED ORDER — ONDANSETRON HCL 4 MG/2ML IJ SOLN
INTRAMUSCULAR | Status: AC
  Filled 2014-09-16: qty 2

## 2014-09-16 MED ORDER — MORPHINE SULFATE 2 MG/ML IJ SOLN: 1.0000 mg | INTRAMUSCULAR | Status: DC | PRN

## 2014-09-16 MED ORDER — ONDANSETRON HCL 4 MG/2ML IJ SOLN
4.0000 mg | Freq: Once | INTRAMUSCULAR | Status: AC | PRN
  Administered 2014-09-16: 4 mg via INTRAVENOUS

## 2014-09-16 MED ORDER — SODIUM CHLORIDE 0.9 % IJ SOLN
INTRAMUSCULAR | Status: AC
  Filled 2014-09-16: qty 10

## 2014-09-16 SURGICAL SUPPLY — 47 items
BLADE SURG 10 STRL SS (BLADE) ×3 IMPLANT
BLADE SURG 15 STRL LF DISP TIS (BLADE) ×1 IMPLANT
BLADE SURG 15 STRL SS (BLADE) ×2
BLADE SURG ROTATE 9660 (MISCELLANEOUS) IMPLANT
CANISTER SUCTION 2500CC (MISCELLANEOUS) ×3 IMPLANT
CHLORAPREP W/TINT 26ML (MISCELLANEOUS) ×3 IMPLANT
COVER SURGICAL LIGHT HANDLE (MISCELLANEOUS) ×3 IMPLANT
DECANTER SPIKE VIAL GLASS SM (MISCELLANEOUS) ×3 IMPLANT
DRAPE PED LAPAROTOMY (DRAPES) ×3 IMPLANT
DRAPE UTILITY XL STRL (DRAPES) ×3 IMPLANT
DRSG TEGADERM 4X4.75 (GAUZE/BANDAGES/DRESSINGS) IMPLANT
ELECT CAUTERY BLADE 6.4 (BLADE) ×3 IMPLANT
ELECT REM PT RETURN 9FT ADLT (ELECTROSURGICAL) ×3
ELECTRODE REM PT RTRN 9FT ADLT (ELECTROSURGICAL) ×1 IMPLANT
GAUZE SPONGE 2X2 8PLY STRL LF (GAUZE/BANDAGES/DRESSINGS) IMPLANT
GLOVE BIO SURGEON STRL SZ7 (GLOVE) ×3 IMPLANT
GLOVE BIOGEL PI IND STRL 6.5 (GLOVE) ×1 IMPLANT
GLOVE BIOGEL PI IND STRL 7.0 (GLOVE) ×1 IMPLANT
GLOVE BIOGEL PI INDICATOR 6.5 (GLOVE) ×2
GLOVE BIOGEL PI INDICATOR 7.0 (GLOVE) ×2
GLOVE SURG SIGNA 7.5 PF LTX (GLOVE) ×3 IMPLANT
GLOVE SURG SS PI 6.5 STRL IVOR (GLOVE) ×3 IMPLANT
GOWN STRL REUS W/ TWL LRG LVL3 (GOWN DISPOSABLE) ×2 IMPLANT
GOWN STRL REUS W/ TWL XL LVL3 (GOWN DISPOSABLE) ×1 IMPLANT
GOWN STRL REUS W/TWL LRG LVL3 (GOWN DISPOSABLE) ×4
GOWN STRL REUS W/TWL XL LVL3 (GOWN DISPOSABLE) ×3
KIT BASIN OR (CUSTOM PROCEDURE TRAY) ×3 IMPLANT
KIT ROOM TURNOVER OR (KITS) ×3 IMPLANT
LIQUID BAND (GAUZE/BANDAGES/DRESSINGS) ×3 IMPLANT
MESH VENTRALEX ST 1-7/10 CRC S (Mesh General) ×3 IMPLANT
NEEDLE HYPO 25GX1X1/2 BEV (NEEDLE) ×3 IMPLANT
NS IRRIG 1000ML POUR BTL (IV SOLUTION) ×3 IMPLANT
PACK SURGICAL SETUP 50X90 (CUSTOM PROCEDURE TRAY) ×3 IMPLANT
PAD ARMBOARD 7.5X6 YLW CONV (MISCELLANEOUS) ×3 IMPLANT
PENCIL BUTTON HOLSTER BLD 10FT (ELECTRODE) ×3 IMPLANT
SPONGE GAUZE 2X2 STER 10/PKG (GAUZE/BANDAGES/DRESSINGS)
SPONGE LAP 18X18 X RAY DECT (DISPOSABLE) ×3 IMPLANT
SUT MNCRL AB 4-0 PS2 18 (SUTURE) ×3 IMPLANT
SUT NOVA NAB DX-16 0-1 5-0 T12 (SUTURE) ×6 IMPLANT
SUT VIC AB 3-0 SH 27 (SUTURE) ×2
SUT VIC AB 3-0 SH 27X BRD (SUTURE) ×1 IMPLANT
SYR CONTROL 10ML LL (SYRINGE) ×3 IMPLANT
TOWEL OR 17X24 6PK STRL BLUE (TOWEL DISPOSABLE) ×3 IMPLANT
TOWEL OR 17X26 10 PK STRL BLUE (TOWEL DISPOSABLE) ×3 IMPLANT
TUBE CONNECTING 12'X1/4 (SUCTIONS) ×1
TUBE CONNECTING 12X1/4 (SUCTIONS) ×2 IMPLANT
YANKAUER SUCT BULB TIP NO VENT (SUCTIONS) ×3 IMPLANT

## 2014-09-16 NOTE — Progress Notes (Signed)
Report given to robin roberts rn as caregiver 

## 2014-09-16 NOTE — Transfer of Care (Signed)
Immediate Anesthesia Transfer of Care Note  Patient: Taylor Huff  Procedure(s) Performed: Procedure(s): HERNIA REPAIR UMBILICAL ADULT with mesh (N/A) INSERTION OF MESH (N/A)  Patient Location: PACU  Anesthesia Type:General  Level of Consciousness: awake, alert  and oriented  Airway & Oxygen Therapy: Patient Spontanous Breathing  Post-op Assessment: Report given to RN  Post vital signs: Reviewed and stable  Last Vitals:  Filed Vitals:   09/16/14 0801  BP: 109/75  Pulse: 79  Temp: 36.8 C  Resp: 18    Complications: No apparent anesthesia complications

## 2014-09-16 NOTE — Op Note (Signed)
NAMESHELENE, KRAGE NO.:  [id number]  MEDICAL RECORD NO.:  [id number]  LOCATION:  [facility]                         FACILITY:  [facility]  PHYSICIAN:  Coralie Keens, M.D. DATE OF BIRTH:  08/03/1980  DATE OF PROCEDURE:  09/16/2014 DATE OF DISCHARGE:  09/16/2014                              OPERATIVE REPORT   PREOPERATIVE DIAGNOSIS:  Incisional hernia at the umbilicus.  POSTOPERATIVE DIAGNOSIS:  Incisional hernia at the umbilicus.  PROCEDURE:  Incisional hernia repair with mesh.  SURGEON:  Coralie Keens, M.D.  ANESTHESIA:  General and 0.5% Marcaine.  ESTIMATED BLOOD LOSS:  Minimal.  FINDINGS:  The patient was found to have a fascial defect at the umbilicus.  It was repaired with a 4.6 cm round ventral patch.  PROCEDURE IN DETAIL: The patient was brought to the operating room, identified as Taylor Huff.  She was placed supine on the operating room table, where general anesthesia was induced.  Her abdomen was then prepped and draped in the usual sterile fashion.  I made a previous transverse incision through her previous scar just below the umbilicus.  I took this down to the hernia sac which I separated from the overlying umbilical skin.  It was a moderate-sized fascial defect.  I excised the sac in its entirety. There was omentum slightly stuck to the sac which I reduced back into the abdominal cavity.  No bowel was involved in the hernia.  Next, a 4.6 cm round ventral patch was brought onto the field.  This was from [facility]. I placed the Ventralex patch through the opening in the fascia and then pulled it up against the peritoneum with stay ties.  I then sewed it in place circumferentially with #1 Novafil sutures.  I then cut the stay ties and closed the fascia over the top of the mesh with several figure- of-eight #1 Novafil sutures as well.  I then anesthetized the fascia circumferentially with Marcaine.  I had excellent coverage of the fascial defect and  closure of the fascia appeared to be achieved.  I then closed the subcutaneous tissue with interrupted 3-0 Vicryl sutures and closed the skin with running 4-0 Monocryl.  Skin glue was then applied. The patient tolerated the procedure well.  All the counts were correct at the end of procedure.  The patient was then extubated in the operating room and taken in a stable condition to recovery room.     Coralie Keens, M.D.     DB/MEDQ  D:  09/16/2014  T:  09/16/2014  Job:  [id number]

## 2014-09-16 NOTE — Anesthesia Procedure Notes (Signed)
Procedure Name: LMA Insertion Date/Time: 09/16/2014 10:06 AM Performed by: Sampson Si E Pre-anesthesia Checklist: Patient identified, Emergency Drugs available, Suction available, Patient being monitored and Timeout performed Patient Re-evaluated:Patient Re-evaluated prior to inductionOxygen Delivery Method: Circle system utilized Preoxygenation: Pre-oxygenation with 100% oxygen Intubation Type: IV induction Ventilation: Mask ventilation without difficulty LMA: LMA inserted LMA Size: 4.0 Number of attempts: 1 Placement Confirmation: positive ETCO2 and breath sounds checked- equal and bilateral Tube secured with: Tape Dental Injury: Teeth and Oropharynx as per pre-operative assessment

## 2014-09-16 NOTE — Discharge Instructions (Signed)
[facility] _______[facility]  UMBILICAL OR INGUINAL HERNIA REPAIR: POST OP INSTRUCTIONS  Always review your discharge instruction sheet given to you by the facility where your surgery was performed. IF YOU HAVE DISABILITY OR FAMILY LEAVE FORMS, YOU MUST BRING THEM TO THE OFFICE FOR PROCESSING.   DO NOT GIVE THEM TO YOUR DOCTOR.  1. A  prescription for pain medication may be given to you upon discharge.  Take your pain medication as prescribed, if needed.  If narcotic pain medicine is not needed, then you may take acetaminophen (Tylenol) or ibuprofen (Advil) as needed. 2. Take your usually prescribed medications unless otherwise directed. 3. If you need a refill on your pain medication, please contact your pharmacy.  They will contact our office to request authorization. Prescriptions will not be filled after 5 pm or on week-ends. 4. You should follow a light diet the first 24 hours after arrival home, such as soup and crackers, etc.  Be sure to include lots of fluids daily.  Resume your normal diet the day after surgery. 5. Most patients will experience some swelling and bruising around the umbilicus or in the groin and scrotum.  Ice packs and reclining will help.  Swelling and bruising can take several days to resolve.  6. It is common to experience some constipation if taking pain medication after surgery.  Increasing fluid intake and taking a stool softener (such as Colace) will usually help or prevent this problem from occurring.  A mild laxative (Milk of Magnesia or Miralax) should be taken according to package directions if there are no bowel movements after 48 hours. 7. Unless discharge instructions indicate otherwise, you may remove your bandages 24-48 hours after surgery, and you may shower at that time.  You may have steri-strips (small skin tapes) in place directly over the incision.  These strips should be left on the skin for 7-10 days.  If your surgeon used skin glue on the  incision, you may shower in 24 hours.  The glue will flake off over the next 2-3 weeks.  Any sutures or staples will be removed at the office during your follow-up visit. 8. ACTIVITIES:  You may resume regular (light) daily activities beginning the next day--such as daily self-care, walking, climbing stairs--gradually increasing activities as tolerated.  You may have sexual intercourse when it is comfortable.  Refrain from any heavy lifting or straining until approved by your doctor. a. You may drive when you are no longer taking prescription pain medication, you can comfortably wear a seatbelt, and you can safely maneuver your car and apply brakes. b. RETURN TO WORK:  __________________________________________________________ 9. You should see your doctor in the office for a follow-up appointment approximately 2-3 weeks after your surgery.  Make sure that you call for this appointment within a day or two after you arrive home to insure a convenient appointment time. 10. OTHER INSTRUCTIONS:  __________________________________________________________________________________________________________________________________________________________________________________________  WHEN TO CALL YOUR DOCTOR: 1. Fever over 101.0 2. Inability to urinate 3. Nausea and/or vomiting 4. Extreme swelling or bruising 5. Continued bleeding from incision. 6. Increased pain, redness, or drainage from the incision  The clinic staff is available to answer your questions during regular business hours.  Please don't hesitate to call and ask to speak to one of the nurses for clinical concerns.  If you have a medical emergency, go to the nearest emergency room or call 911.  A surgeon from [facility] is always on call at the hospital     [address], [address], [place], [place]  [address] ?  [address] [address], [place], [place]   [address] [phone] ? [phone] ? FAX [phone] Web site:  [email or web address]  

## 2014-09-16 NOTE — Interval H&P Note (Signed)
History and Physical Interval Note: no change in H and P  09/16/2014 9:51 AM  Taylor Huff  has presented today for surgery, with the diagnosis of Incisional Hernia at the Umbilicus  The various methods of treatment have been discussed with the patient and family. After consideration of risks, benefits and other options for treatment, the patient has consented to  Procedure(s): HERNIA REPAIR UMBILICAL ADULT with mesh (N/A) INSERTION OF MESH (N/A) as a surgical intervention .  The patient's history has been reviewed, patient examined, no change in status, stable for surgery.  I have reviewed the patient's chart and labs.  Questions were answered to the patient's satisfaction.     Taylor Huff A

## 2014-09-16 NOTE — Anesthesia Postprocedure Evaluation (Signed)
Anesthesia Post Note  Patient: Taylor Huff  Procedure(s) Performed: Procedure(s) (LRB): HERNIA REPAIR UMBILICAL ADULT with mesh (N/A) INSERTION OF MESH (N/A)  Anesthesia type: general  Patient location: PACU  Post pain: Pain level controlled  Post assessment: Patient's Cardiovascular Status Stable  Last Vitals:  Filed Vitals:   09/16/14 1100  BP: 103/62  Pulse: 77  Temp:   Resp: 17    Post vital signs: Reviewed and stable  Level of consciousness: sedated  Complications: No apparent anesthesia complications

## 2014-09-16 NOTE — Op Note (Signed)
HERNIA REPAIR UMBILICAL ADULT with mesh, INSERTION OF MESH  Procedure Note  Taylor Huff 09/16/2014   Pre-op Diagnosis: Incisional Hernia at the Umbilicus     Post-op Diagnosis: same  Procedure(s): HERNIA REPAIR UMBILICAL ADULT with mesh INSERTION OF MESH  Surgeon(s): Coralie Keens, MD  Anesthesia: General  Staff:  Circulator: Beverlee Nims, RN Scrub Person: Jon Gills Kretschmaier  Estimated Blood Loss: Minimal                         [name]   Date: 09/16/2014  Time: 10:39 AM

## 2014-09-16 NOTE — Anesthesia Preprocedure Evaluation (Addendum)
Anesthesia Evaluation  Patient identified by MRN, date of birth, ID band Patient awake    Reviewed: Allergy & Precautions, NPO status , Patient's Chart, lab work & pertinent test results  Airway Mallampati: II  TM Distance: >3 FB Neck ROM: Full    Dental  (+) Teeth Intact, Dental Advisory Given   Pulmonary Current Smoker,    Pulmonary exam normal       Cardiovascular Normal cardiovascular examRhythm:Regular Rate:Normal     Neuro/Psych    GI/Hepatic   Endo/Other    Renal/GU      Musculoskeletal   Abdominal   Peds  Hematology   Anesthesia Other Findings   Reproductive/Obstetrics                         Anesthesia Physical Anesthesia Plan  ASA: II  Anesthesia Plan: General   Post-op Pain Management:    Induction: Intravenous  Airway Management Planned: LMA  Additional Equipment:   Intra-op Plan:   Post-operative Plan: Extubation in OR  Informed Consent: I have reviewed the patients History and Physical, chart, labs and discussed the procedure including the risks, benefits and alternatives for the proposed anesthesia with the patient or authorized representative who has indicated his/her understanding and acceptance.   Dental advisory given  Plan Discussed with: CRNA and Surgeon  Anesthesia Plan Comments:        Anesthesia Quick Evaluation

## 2014-09-20 ENCOUNTER — Encounter (HOSPITAL_COMMUNITY): Payer: Self-pay | Admitting: Surgery

## 2014-12-17 ENCOUNTER — Other Ambulatory Visit: Payer: Self-pay | Admitting: Family Medicine

## 2014-12-27 ENCOUNTER — Ambulatory Visit: Payer: 59

## 2014-12-27 ENCOUNTER — Ambulatory Visit (INDEPENDENT_AMBULATORY_CARE_PROVIDER_SITE_OTHER): Payer: 59

## 2014-12-27 ENCOUNTER — Encounter: Payer: Self-pay | Admitting: Family Medicine

## 2014-12-27 ENCOUNTER — Ambulatory Visit (INDEPENDENT_AMBULATORY_CARE_PROVIDER_SITE_OTHER): Payer: 59 | Admitting: Family Medicine

## 2014-12-27 VITALS — BP 125/94 | HR 69 | Temp 98.2°F | Resp 16 | Wt 180.0 lb

## 2014-12-27 DIAGNOSIS — G4700 Insomnia, unspecified: Secondary | ICD-10-CM | POA: Diagnosis not present

## 2014-12-27 DIAGNOSIS — R109 Unspecified abdominal pain: Secondary | ICD-10-CM

## 2014-12-27 DIAGNOSIS — H538 Other visual disturbances: Secondary | ICD-10-CM

## 2014-12-27 LAB — FOLATE: Folate: 11.9 ng/mL

## 2014-12-27 LAB — COMPREHENSIVE METABOLIC PANEL
ALK PHOS: 43 U/L (ref 33–115)
ALT: 19 U/L (ref 6–29)
AST: 17 U/L (ref 10–30)
Albumin: 4.3 g/dL (ref 3.6–5.1)
BUN: 13 mg/dL (ref 7–25)
CALCIUM: 8.9 mg/dL (ref 8.6–10.2)
CHLORIDE: 108 mmol/L (ref 98–110)
CO2: 19 mmol/L — ABNORMAL LOW (ref 20–31)
Creat: 0.9 mg/dL (ref 0.50–1.10)
Glucose, Bld: 83 mg/dL (ref 65–99)
Potassium: 4.7 mmol/L (ref 3.5–5.3)
Sodium: 139 mmol/L (ref 135–146)
TOTAL PROTEIN: 6.8 g/dL (ref 6.1–8.1)
Total Bilirubin: 0.4 mg/dL (ref 0.2–1.2)

## 2014-12-27 LAB — LIPASE: Lipase: 16 U/L (ref 7–60)

## 2014-12-27 LAB — VITAMIN B12: VITAMIN B 12: 577 pg/mL (ref 211–911)

## 2014-12-27 MED ORDER — ALPRAZOLAM 1 MG PO TABS: ORAL_TABLET | ORAL | Status: AC

## 2014-12-27 NOTE — Progress Notes (Signed)
[facility] [address], [place] [address] [phone]  Date:  12/27/2014   Name:  Taylor Huff   DOB:  18-Sep-1980   MRN:  [id number]  PCP:  Lamar Blinks, MD    Chief Complaint: side pain and blurry vision   History of Present Illness:  Taylor Huff is a 34 y.o. very pleasant female patient who presents with the following:  Last seen by myself 14 months ago with left side pain.  This led to a dx of chronic cholecystitis and a lap chole in July 2015.  Her pain persisted so she underwent a CT (negative except for small ventral hernia).  She then underwent a hernia repear this May.  (In the meantime she had an MRI of her neck and cervical spine surgery in October of last year, and a D&C/ ablation in December)   She notes that she continues to have left side pain which has been present for over a year. She notes the pain just under the anterior left ribs. It is present "every day."  It may be worse with more prolonged standing or walking.  She is generally ok when sitting She has noted more diarrhea since she got her gallbladder out; OW she has not noted any changes in her bowel habits She is frustrated by this continued pain but understands that I have not seen her in over a year- during which time much has happened to her- so I do not immediately know the cause of her pain.    Wt Readings from Last 3 Encounters:  12/27/14 180 lb (81.647 kg)  09/16/14 183 lb (83.008 kg)  09/13/14 183 lb 6.8 oz (83.2 kg)   Additionally yesterday when driving home she noted that "I couldn't see nothing clear."  She describes blurred vision but cannot give further characterization. However it does not sound like an aura, she did not seem to have floaters or flashers. This lasted for about 30 minutes. She has also noted headaches for a couple of weeks, and has not slept well for a couple of weeks either. She states that her "mind is going a hundred miles an hour."  She has used  xanax on occasion in the past and is using it more regularly as of late- needs more Blurred vision effected both eyes.   She pulled over until her vision cleared but did not otherwise seek any care She has not had this any other times She has never had an eye exam  She does not get menses due to ablation and is s/p BTL   Patient Active Problem List   Diagnosis Date Noted  . Abdominal pain, left upper quadrant 12/27/2013  . 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 Chronic cholecystitis with calculus 11/11/2013    Past Medical History  Diagnosis Date  . Anxiety   . Heart murmur   . History of stomach ulcers   . Sleep apnea     with child birth, not now  . History of hiatal hernia   . Complication of anesthesia     pt concerned about hyperextending neck due to recent neck injury    Past Surgical History  Procedure Laterality Date  . Tubal ligation    . Hemmorhoids    . Plantar wart removal     . Cholecystectomy N/A 11/12/2013    Procedure: LAPAROSCOPIC CHOLECYSTECTOMY POSSIBLE IOC;  Surgeon: Stark Klein, MD;  Location: [facility];  Service: General;  Laterality: N/A;  . Dilitation &  currettage/hystroscopy with novasure ablation N/A 05/05/2014    Procedure: DILATATION & CURETTAGE/HYSTEROSCOPY WITH NOVASURE ABLATION;  Surgeon: Annalee Genta, DO;  Location: [facility];  Service: Gynecology;  Laterality: N/A;  . Back surgery      cervical fusion L4,5,6  . Umbilical hernia repair N/A 09/16/2014    Procedure: HERNIA REPAIR UMBILICAL ADULT with mesh;  Surgeon: Coralie Keens, MD;  Location: [place];  Service: General;  Laterality: N/A;  . Insertion of mesh N/A 09/16/2014    Procedure: INSERTION OF MESH;  Surgeon: Coralie Keens, MD;  Location: [place];  Service: General;  Laterality: N/A;    Social History  Substance Use Topics  . Smoking status: Current Every Day Smoker -- 0.50 packs/day for 10 years    Types: Cigarettes  . Smokeless tobacco: Never Used  . Alcohol Use: Yes      Comment: socially     Family History  Problem Relation Age of Onset  . Hypertension Mother     Allergies  Allergen Reactions  . Penicillins Swelling    Medication list has been reviewed and updated.  Current Outpatient Prescriptions on File Prior to Visit  Medication Sig Dispense Refill  . ALPRAZolam (XANAX) 1 MG tablet TAKE 1/2 TABLET UP TO TWICE DAILY AS NEEDED FOR ANXIETY/SLEEP 10 tablet 0  . diphenhydrAMINE (BENADRYL) 25 MG tablet Take 25 mg by mouth every 8 (eight) hours as needed for allergies.     . fluticasone (FLONASE) 50 MCG/ACT nasal spray Place 1 spray into both nostrils daily as needed for allergies or rhinitis.     Marland Kitchen ibuprofen (ADVIL,MOTRIN) 200 MG tablet Take 600 mg by mouth every 6 (six) hours as needed for moderate pain.    Marland Kitchen loratadine (CLARITIN) 10 MG tablet Take 10 mg by mouth daily as needed for allergies.    . naproxen sodium (ANAPROX) 220 MG tablet Take 440 mg by mouth daily as needed (neck pain.).    Marland Kitchen oxyCODONE-acetaminophen (PERCOCET) 5-325 MG per tablet Take 1-2 tablets by mouth every 4 (four) hours as needed for severe pain. (Patient not taking: Reported on 12/27/2014) 40 tablet 0   No current facility-administered medications on file prior to visit.    Review of Systems:  As per HPI- otherwise negative.   Physical Examination: Filed Vitals:   12/27/14 0833  BP: 125/94  Pulse: 69  Temp: 98.2 F (36.8 C)  Resp: 16   Filed Vitals:   12/27/14 0833  Weight: 180 lb (81.647 kg)   Body mass index is 28.19 kg/(m^2). Ideal Body Weight:    GEN: WDWN, NAD, Non-toxic, A & O x 3, overweight, looks well HEENT: Atraumatic, Normocephalic. Neck supple. No masses, No LAD.  Bilateral TM wnl, oropharynx normal.  PEERL,EOMI.   Ears and Nose: No external deformity. CV: RRR, No M/G/R. No JVD. No thrill. No extra heart sounds. PULM: CTA B, no wheezes, crackles, rhonchi. No retractions. No resp. distress. No accessory muscle use. ABD: S, ND, +BS. No rebound. No  HSM.  She notes tenderness under the left upper ribs.  No mass, hernia, or other abnormality is noted on exam EXTR: No c/c/e NEURO Normal gait.  PSYCH: Normally interactive. Conversant. Not depressed or anxious appearing.  Calm demeanor.   [facility] reading (PRIMARY) by  Dr. Lorelei Pont. Left ribs: negative CXR: negative  CHEST 1 VIEW COMPARISON: PA view of the chest performed with rib series today  FINDINGS: Lungs are clear. No effusions. Heart is normal size. No acute bony abnormality.  IMPRESSION: No  active disease.  LEFT RIBS - 2 VIEW COMPARISON: Chest x-ray, same date  FINDINGS: The cardiac silhouette, mediastinal and hilar contours are normal. The lungs are clear. No definite left-sided rib fractures or worrisome bone lesions.  IMPRESSION: No acute bony finding  BP Readings from Last 3 Encounters:  12/27/14 125/94  09/16/14 114/79  09/13/14 112/76   Assessment and Plan: Blurred vision - Plan: Hemoglobin A1c, Vitamin B12, Folate, Ambulatory referral to Ophthalmology, Thyroid Panel With TSH, CANCELED: T4 AND TSH  Left sided abdominal pain - Plan: CBC, Comprehensive metabolic panel, DG Ribs Unilateral Left, DG Chest 1 View, Lipase, Thyroid Panel With TSH  Insomnia - Plan: ALPRAZolam (XANAX) 1 MG tablet  Here today with left sided abdominal pain for more than a year, an episode of blurred vision yesterday as well as insomnia/ anxiety Offered imaging of her head- however as her blurred vision sx are now totally resolved she feels comfortable deferring, but will do labs as above and refer to optho.  Seek care right away if vision sx return Explained that the cause of her long- term side pain is not readily apparent.  Will get labs as above.  She had a CT of her abd/pelvis as well as MRI of the thoracic spine not long ago.  Will follow-up with her.   Refilled xanax for her, cautioned regarding habitual use   Signed Lamar Blinks, MD

## 2014-12-27 NOTE — Patient Instructions (Addendum)
Your x-rays look good, and I will be in touch with your lab results asap. We will try and find out why you have this left side pain We will also schedule you to see an eye doctor for a thorough exam- let me know if you do not hear about this appt soon If you have any other episodes of blurred vision or other unusual symptoms please seek care right away  You may continue to use xanax as needed for sleep- however remember that this is a habit forming medication, use it as little as possible

## 2014-12-28 ENCOUNTER — Other Ambulatory Visit: Payer: 59

## 2014-12-29 LAB — CBC
HEMATOCRIT: 39.6 % (ref 36.0–46.0)
Hemoglobin: 13 g/dL (ref 12.0–15.0)
MCH: 31.3 pg (ref 26.0–34.0)
MCHC: 32.8 g/dL (ref 30.0–36.0)
MCV: 95.4 fL (ref 78.0–100.0)
MPV: 11 fL (ref 8.6–12.4)
Platelets: 298 10*3/uL (ref 150–400)
RBC: 4.15 MIL/uL (ref 3.87–5.11)
RDW: 13.4 % (ref 11.5–15.5)
WBC: 8.8 10*3/uL (ref 4.0–10.5)

## 2014-12-29 LAB — THYROID PANEL WITH TSH
FREE THYROXINE INDEX: 2.1 (ref 1.4–3.8)
T3 Uptake: 29 % (ref 22–35)
T4, Total: 7.2 ug/dL (ref 4.5–12.0)
TSH: 2.537 u[IU]/mL (ref 0.350–4.500)

## 2014-12-29 LAB — HEMOGLOBIN A1C
Hgb A1c MFr Bld: 5.3 % (ref <5.7)
Mean Plasma Glucose: 105 mg/dL (ref <117)

## 2015-01-03 ENCOUNTER — Other Ambulatory Visit: Payer: Self-pay | Admitting: Family Medicine

## 2015-04-26 ENCOUNTER — Encounter (HOSPITAL_COMMUNITY): Payer: Self-pay | Admitting: Emergency Medicine

## 2015-04-26 ENCOUNTER — Emergency Department (HOSPITAL_COMMUNITY): Payer: 59

## 2015-04-26 ENCOUNTER — Emergency Department (HOSPITAL_COMMUNITY)
Admission: EM | Admit: 2015-04-26 | Discharge: 2015-04-26 | Disposition: A | Discharge status: Home / Self Care | Payer: 59 | Attending: Emergency Medicine | Admitting: Emergency Medicine

## 2015-04-26 DIAGNOSIS — Z9049 Acquired absence of other specified parts of digestive tract: Secondary | ICD-10-CM | POA: Insufficient documentation

## 2015-04-26 DIAGNOSIS — Z880 Allergy status to penicillin: Secondary | ICD-10-CM | POA: Diagnosis not present

## 2015-04-26 DIAGNOSIS — Z3202 Encounter for pregnancy test, result negative: Secondary | ICD-10-CM | POA: Diagnosis not present

## 2015-04-26 DIAGNOSIS — Z9889 Other specified postprocedural states: Secondary | ICD-10-CM | POA: Insufficient documentation

## 2015-04-26 DIAGNOSIS — R109 Unspecified abdominal pain: Secondary | ICD-10-CM

## 2015-04-26 DIAGNOSIS — F17210 Nicotine dependence, cigarettes, uncomplicated: Secondary | ICD-10-CM | POA: Diagnosis not present

## 2015-04-26 DIAGNOSIS — Z9851 Tubal ligation status: Secondary | ICD-10-CM | POA: Diagnosis not present

## 2015-04-26 DIAGNOSIS — R079 Chest pain, unspecified: Secondary | ICD-10-CM | POA: Diagnosis present

## 2015-04-26 DIAGNOSIS — R011 Cardiac murmur, unspecified: Secondary | ICD-10-CM | POA: Insufficient documentation

## 2015-04-26 DIAGNOSIS — Z79899 Other long term (current) drug therapy: Secondary | ICD-10-CM | POA: Insufficient documentation

## 2015-04-26 DIAGNOSIS — F419 Anxiety disorder, unspecified: Secondary | ICD-10-CM | POA: Insufficient documentation

## 2015-04-26 DIAGNOSIS — R0789 Other chest pain: Secondary | ICD-10-CM | POA: Diagnosis not present

## 2015-04-26 DIAGNOSIS — R1012 Left upper quadrant pain: Secondary | ICD-10-CM | POA: Insufficient documentation

## 2015-04-26 DIAGNOSIS — Z8719 Personal history of other diseases of the digestive system: Secondary | ICD-10-CM | POA: Diagnosis not present

## 2015-04-26 DIAGNOSIS — Z8669 Personal history of other diseases of the nervous system and sense organs: Secondary | ICD-10-CM | POA: Diagnosis not present

## 2015-04-26 LAB — URINALYSIS, ROUTINE W REFLEX MICROSCOPIC
Bilirubin Urine: NEGATIVE
Glucose, UA: NEGATIVE mg/dL
Hgb urine dipstick: NEGATIVE
Ketones, ur: NEGATIVE mg/dL
Leukocytes, UA: NEGATIVE
NITRITE: NEGATIVE
Protein, ur: NEGATIVE mg/dL
SPECIFIC GRAVITY, URINE: 1.018 (ref 1.005–1.030)
pH: 6 (ref 5.0–8.0)

## 2015-04-26 LAB — COMPREHENSIVE METABOLIC PANEL
ALK PHOS: 38 U/L (ref 38–126)
ALT: 24 U/L (ref 14–54)
ANION GAP: 10 (ref 5–15)
AST: 21 U/L (ref 15–41)
Albumin: 5 g/dL (ref 3.5–5.0)
BILIRUBIN TOTAL: 0.5 mg/dL (ref 0.3–1.2)
BUN: 16 mg/dL (ref 6–20)
CALCIUM: 9.5 mg/dL (ref 8.9–10.3)
CO2: 23 mmol/L (ref 22–32)
Chloride: 106 mmol/L (ref 101–111)
Creatinine, Ser: 0.83 mg/dL (ref 0.44–1.00)
GFR calc non Af Amer: >60 mL/min (ref >60)
Glucose, Bld: 92 mg/dL (ref 65–99)
POTASSIUM: 4.1 mmol/L (ref 3.5–5.1)
SODIUM: 139 mmol/L (ref 135–145)
TOTAL PROTEIN: 7.7 g/dL (ref 6.5–8.1)

## 2015-04-26 LAB — CBC
HEMATOCRIT: 40.7 % (ref 36.0–46.0)
HEMOGLOBIN: 13.8 g/dL (ref 12.0–15.0)
MCH: 32.6 pg (ref 26.0–34.0)
MCHC: 33.9 g/dL (ref 30.0–36.0)
MCV: 96.2 fL (ref 78.0–100.0)
Platelets: 292 10*3/uL (ref 150–400)
RBC: 4.23 MIL/uL (ref 3.87–5.11)
RDW: 13.1 % (ref 11.5–15.5)
WBC: 7.9 10*3/uL (ref 4.0–10.5)

## 2015-04-26 LAB — LIPASE, BLOOD: Lipase: 24 U/L (ref 11–51)

## 2015-04-26 LAB — PREGNANCY, URINE: Preg Test, Ur: NEGATIVE

## 2015-04-26 MED ORDER — CYCLOBENZAPRINE HCL 10 MG PO TABS: 10.0000 mg | ORAL_TABLET | Freq: Every day | ORAL | Status: AC

## 2015-04-26 MED ORDER — IOHEXOL 300 MG/ML  SOLN
100.0000 mL | Freq: Once | INTRAMUSCULAR | Status: AC | PRN
  Administered 2015-04-26: 100 mL via INTRAVENOUS

## 2015-04-26 MED ORDER — PREDNISONE 50 MG PO TABS: 50.0000 mg | ORAL_TABLET | Freq: Every day | ORAL | Status: DC

## 2015-04-26 MED ORDER — IOHEXOL 300 MG/ML  SOLN
50.0000 mL | Freq: Once | INTRAMUSCULAR | Status: AC | PRN
  Administered 2015-04-26: 50 mL via ORAL

## 2015-04-26 MED ORDER — DIPHENHYDRAMINE HCL 50 MG/ML IJ SOLN
25.0000 mg | Freq: Once | INTRAMUSCULAR | Status: AC
  Administered 2015-04-26: 25 mg via INTRAVENOUS
  Filled 2015-04-26: qty 1

## 2015-04-26 MED ORDER — HYDROMORPHONE HCL 1 MG/ML IJ SOLN
1.0000 mg | Freq: Once | INTRAMUSCULAR | Status: AC
  Administered 2015-04-26: 1 mg via INTRAVENOUS
  Filled 2015-04-26: qty 1

## 2015-04-26 MED ORDER — SODIUM CHLORIDE 0.9 % IV BOLUS (SEPSIS)
1000.0000 mL | Freq: Once | INTRAVENOUS | Status: AC
  Administered 2015-04-26: 1000 mL via INTRAVENOUS

## 2015-04-26 MED ORDER — OXYCODONE-ACETAMINOPHEN 5-325 MG PO TABS: 1.0000 | ORAL_TABLET | Freq: Four times a day (QID) | ORAL | Status: AC | PRN

## 2015-04-26 NOTE — ED Notes (Addendum)
Pt c/o LUQ abdominal pain/rib cage pain radiating to left mid back that has been intermittent for almost a year. C/o tenderness in LUQ. No associated N/V/D/dysuria. Says she was standing up getting ready for work and "it hit me all of the sudden and I couldn't set up straight." No rash noted. Had abdominal x-rays completed recently and [facility] ordered per MD Copeland. Has appointment with PCP tomorrow but says she cannot bear the pain. Says, "I don't want pain medication I just want to figure out what's going on." Hx cholecystectomy. No other c/c. No issues with constipation. Has normal BMs.

## 2015-04-26 NOTE — Discharge Instructions (Signed)
Return here as needed.  Follow-up with your neurosurgeon and primary care doctor

## 2015-04-26 NOTE — ED Notes (Signed)
Pt taken to CT.

## 2015-04-26 NOTE — ED Provider Notes (Signed)
CSN: [id number]     Arrival date & time 04/26/15  1251 History   First MD Initiated Contact with Patient 04/26/15 1501     Chief Complaint  Patient presents with  . Abdominal Pain  . Rib Cage Pain     HPI  Patient is a 34 y/o WF with a PMH of cholecystectomy and umbilical hernia repair with mesh x1 year ago here with a CC of L sided rib cage pain for the past year that has worsened this week. She describes the pain as sharp and localized to the L lower rib cage area.  Pain worsened with standing, bending over, and coughing. It is relieved with sitting and laying down.  She has seen her PCP for the same complaint in the past.  At that time her CXR was negative for any bony abnormalities of the rib cage.  She has an appt to see her PCP tomorrow but the pain was so severe that she decided to come to the ED.  Denies fever, N/V, dysuria, SOB, CP, diarrhea, constipation, changes in pain with oral intake, heartburn, hx of ulcers. She works at the [employer] and does not do any strenuous activity.  No hx of trauma to the area. She is visibly upset during the encounter and hopes to find the cause of her pain.    Past Medical History  Diagnosis Date  . Anxiety   . Heart murmur   . History of stomach ulcers   . Sleep apnea     with child birth, not now  . History of hiatal hernia   . Complication of anesthesia     pt concerned about hyperextending neck due to recent neck injury   Past Surgical History  Procedure Laterality Date  . Tubal ligation    . Hemmorhoids    . Plantar wart removal     . Cholecystectomy N/A 11/12/2013    Procedure: LAPAROSCOPIC CHOLECYSTECTOMY POSSIBLE IOC;  Surgeon: Stark Klein, MD;  [facility]: [facility];  Service: General;  Laterality: N/A;  . Dilitation & currettage/hystroscopy with novasure ablation N/A 05/05/2014    Procedure: DILATATION & CURETTAGE/HYSTEROSCOPY WITH NOVASURE ABLATION;  Surgeon: Annalee Genta, DO;  Location: [facility];  Service: Gynecology;  Laterality: N/A;  .  Back surgery      cervical fusion L4,5,6  . Umbilical hernia repair N/A 09/16/2014    Procedure: HERNIA REPAIR UMBILICAL ADULT with mesh;  Surgeon: Coralie Keens, MD;  Location: [facility];  Service: General;  Laterality: N/A;  . Insertion of mesh N/A 09/16/2014    Procedure: INSERTION OF MESH;  Surgeon: Coralie Keens, MD;  Location: [facility];  Service: General;  Laterality: N/A;   Family History  Problem Relation Age of Onset  . Hypertension Mother    Social History  Substance Use Topics  . Smoking status: Current Every Day Smoker -- 0.50 packs/day for 10 years    Types: Cigarettes  . Smokeless tobacco: Never Used  . Alcohol Use: Yes     Comment: socially    OB History    No data available     Review of Systems All other systems negative except as documented in the HPI. All pertinent positives and negatives as reviewed in the HPI.    Allergies  Penicillins  Home Medications   Prior to Admission medications   Medication Sig Start Date End Date Taking? Authorizing Provider  HYDROcodone-acetaminophen (NORCO) 10-325 MG tablet Take 0.5-1 tablets by mouth every 6 (six) hours as needed for moderate  pain.  04/21/15  Yes Historical Provider, MD  ibuprofen (ADVIL,MOTRIN) 200 MG tablet Take 800 mg by mouth every 6 (six) hours as needed for moderate pain.    Yes Historical Provider, MD  loratadine (CLARITIN) 10 MG tablet Take 10 mg by mouth daily as needed for allergies.   Yes Historical Provider, MD  naproxen sodium (ANAPROX) 220 MG tablet Take 440 mg by mouth daily as needed (neck pain.).   Yes Historical Provider, MD  ALPRAZolam (XANAX) 1 MG tablet TAKE 1/2 TABLET UP TO TWICE DAILY AS NEEDED FOR ANXIETY/SLEEP 12/27/14   Gay Filler Copland, MD  cyclobenzaprine (FLEXERIL) 10 MG tablet Take 10 mg by mouth 3 (three) times daily as needed for muscle spasms.  04/21/15   Historical Provider, MD  methylPREDNISolone (MEDROL DOSEPAK) 4 MG TBPK tablet Take 1-6 tablets by mouth as directed. 04/21/15    Historical Provider, MD   BP 155/98 mmHg  Pulse 96  Temp(Src) 97.6 F (36.4 C) (Oral)  Resp 20  SpO2 97% Physical Exam  Constitutional: She is oriented to person, place, and time. She appears well-developed and well-nourished. No distress.  HENT:  Head: Normocephalic and atraumatic.  Mouth/Throat: Oropharynx is clear and moist.  Neck: Normal range of motion. Neck supple.  Cardiovascular: Normal rate, regular rhythm and normal heart sounds.   Pulmonary/Chest: Effort normal and breath sounds normal.  Abdominal: Soft. Normal appearance and bowel sounds are normal. There is no hepatosplenomegaly. There is tenderness in the left upper quadrant. There is no rigidity, no rebound, no guarding and no CVA tenderness.  LUQ pain in the area of the lower rib cage.   Neurological: She is alert and oriented to person, place, and time. She exhibits normal muscle tone. Coordination normal.  Skin: Skin is warm and dry. No rash noted. No erythema.  Psychiatric: She has a normal mood and affect. Her behavior is normal.  Nursing note and vitals reviewed.   ED Course  Procedures (including critical care time) Labs Review Labs Reviewed  LIPASE, BLOOD  COMPREHENSIVE METABOLIC PANEL  CBC  URINALYSIS, ROUTINE W REFLEX MICROSCOPIC (NOT AT [facility])  PREGNANCY, URINE    Imaging Review Ct Abdomen Pelvis W Contrast  04/26/2015  CLINICAL DATA:  Left upper quadrant pain intermittent for over 1 year EXAM: CT ABDOMEN AND PELVIS WITH CONTRAST TECHNIQUE: Multidetector CT imaging of the abdomen and pelvis was performed using the standard protocol following bolus administration of intravenous contrast. CONTRAST:  40mL OMNIPAQUE IOHEXOL 300 MG/ML SOLN, 130mL OMNIPAQUE IOHEXOL 300 MG/ML SOLN COMPARISON:  None. FINDINGS: Lung bases are free of acute infiltrate or sizable effusion. Gallbladder has been surgically removed. The liver, spleen, adrenal glands and pancreas are within normal limits. Kidneys are well visualized  bilaterally. No renal calculi or urinary tract obstructive changes are seen. The appendix is within normal limits. Scattered mild diverticular change is noted without diverticulitis. Bladder is well distended. The uterus is within normal limits. Small bilateral ovarian cysts are seen. No free pelvic fluid is noted. The bony structures are within normal limits. IMPRESSION: No acute abnormality noted. Electronically Signed   By: Inez Catalina M.D.   On: 04/26/2015 18:05   I have personally reviewed and evaluated these images and lab results as part of my medical decision-making.   EKG Interpretation None      Assessment & Plan  Pain management with dilaudid  CT abdomen pelvis w/contrast   The patient will be referred back to her doctor and neurosurgeon this can be related to  her thoracic spine.  CT scan of her abdomen and lower chest show any abnormalities.  I have advised patient the results and all questions were answered.  Patient agrees the plan  Dalia Heading, PA-C 04/26/15 1923  Orlie Dakin, MD 04/26/15 [phone]

## 2015-04-27 ENCOUNTER — Ambulatory Visit (INDEPENDENT_AMBULATORY_CARE_PROVIDER_SITE_OTHER): Payer: 59 | Admitting: Family Medicine

## 2015-04-27 ENCOUNTER — Encounter: Payer: Self-pay | Admitting: Family Medicine

## 2015-04-27 VITALS — BP 125/81 | HR 67 | Temp 97.9°F | Resp 16 | Ht 66.0 in | Wt 185.0 lb

## 2015-04-27 DIAGNOSIS — R109 Unspecified abdominal pain: Secondary | ICD-10-CM | POA: Diagnosis not present

## 2015-04-27 MED ORDER — LIDOCAINE 5 % EX PTCH: 1.0000 | MEDICATED_PATCH | CUTANEOUS | Status: AC

## 2015-04-27 NOTE — Patient Instructions (Signed)
I am sorry that you are in pain- the good news is that it does not appear to be anything dangerous. Your pain right now seems most consistent with a body wall strain/ muscle tear of some type Try the lidocaine patches as directed.    The ER gave you an rx for flexeril (muscle relaxer) and for percocet (pain medication that is a bit stronger than the hydrocodone that you already had.  These medications may also help you!    Please let me know how you are doing over the next couple of days- if you are not better we could try a nerve pain medication such as gabapentin or lyrica.  Keep me posted!

## 2015-04-27 NOTE — Progress Notes (Signed)
[facility] [address], [place] [address] [phone]  Date:  04/27/2015   Name:  Taylor Huff   DOB:  1981-04-29   MRN:  [id number]  PCP:  Lamar Blinks, MD    Chief Complaint: Follow-up   History of Present Illness:  Taylor Huff is a 34 y.o. very pleasant female patient who presents with the following:  Here today to follow-up- she was in the ER yesterday with complaint of left sided rib pain (which has been present intermittently for over a year), worse for one week.  She had negative labs and a negative CT of her abd and pelvis.  She was given an rx for flexeril and percocet as well as a steroid but she did not fill these yet.  She is actually already on vidodin per her neurosurgery for pain between her shoulders.  She states that over the last several months her pain has been intermittent- not there daily.  It may strike her suddenly, more often when she is standing up.    I last saw her for this in August of this year- at that time she had negative chest and rib films.  She also was seen for this pain back in 2015 which led to a lap chole in July, and then to a hernia repair in May of 2016.   She states that she went to the ER yesterday because she was "doubling over" in pain, and today is not much better.  She felt a little better this am, but her skin feels sore to the touch.   Her skin has never felt sore in the past.  "it hurt so bad yesterday you might as well have took a knife and stuck it in my back."  She still plans to go to work this evening She has generally been a healthy person   Patient Active Problem List   Diagnosis Date Noted  . Abdominal pain, left upper quadrant 12/27/2013  . 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 Chronic cholecystitis with calculus 11/11/2013    Past Medical History  Diagnosis Date  . Anxiety   . Heart murmur   . History of stomach ulcers   . Sleep apnea     with child birth, not now  . History of  hiatal hernia   . Complication of anesthesia     pt concerned about hyperextending neck due to recent neck injury    Past Surgical History  Procedure Laterality Date  . Tubal ligation    . Hemmorhoids    . Plantar wart removal     . Cholecystectomy N/A 11/12/2013    Procedure: LAPAROSCOPIC CHOLECYSTECTOMY POSSIBLE IOC;  Surgeon: Stark Klein, MD;  [facility]: [facility];  Service: General;  Laterality: N/A;  . Dilitation & currettage/hystroscopy with novasure ablation N/A 05/05/2014    Procedure: DILATATION & CURETTAGE/HYSTEROSCOPY WITH NOVASURE ABLATION;  Surgeon: Annalee Genta, DO;  Location: [facility];  Service: Gynecology;  Laterality: N/A;  . Back surgery      cervical fusion L4,5,6  . Umbilical hernia repair N/A 09/16/2014    Procedure: HERNIA REPAIR UMBILICAL ADULT with mesh;  Surgeon: Coralie Keens, MD;  Location: [facility];  Service: General;  Laterality: N/A;  . Insertion of mesh N/A 09/16/2014    Procedure: INSERTION OF MESH;  Surgeon: Coralie Keens, MD;  Location: [facility];  Service: General;  Laterality: N/A;    Social History  Substance Use Topics  . Smoking status: Current Every Day  Smoker -- 0.50 packs/day for 10 years    Types: Cigarettes  . Smokeless tobacco: Never Used  . Alcohol Use: Yes     Comment: socially     Family History  Problem Relation Age of Onset  . Hypertension Mother     Allergies  Allergen Reactions  . Penicillins Swelling    Has patient had a PCN reaction causing immediate rash, facial/tongue/throat swelling, SOB or lightheadedness with hypotension: unknown Has patient had a PCN reaction causing severe rash involving mucus membranes or skin necrosis: unknown Has patient had a PCN reaction that required hospitalization unknown Has patient had a PCN reaction occurring within the last 10 years: No If all of the above answers are "NO", then may proceed with Cephalosporin use.     Medication list has been reviewed and updated.  Current Outpatient  Prescriptions on File Prior to Visit  Medication Sig Dispense Refill  . cyclobenzaprine (FLEXERIL) 10 MG tablet Take 1 tablet (10 mg total) by mouth at bedtime. 10 tablet 0  . HYDROcodone-acetaminophen (NORCO) 10-325 MG tablet Take 0.5-1 tablets by mouth every 6 (six) hours as needed for moderate pain.   0  . ibuprofen (ADVIL,MOTRIN) 200 MG tablet Take 800 mg by mouth every 6 (six) hours as needed for moderate pain.     Marland Kitchen ALPRAZolam (XANAX) 1 MG tablet TAKE 1/2 TABLET UP TO TWICE DAILY AS NEEDED FOR ANXIETY/SLEEP (Patient not taking: Reported on 04/27/2015) 30 tablet 2  . loratadine (CLARITIN) 10 MG tablet Take 10 mg by mouth daily as needed for allergies. Reported on 04/27/2015    . methylPREDNISolone (MEDROL DOSEPAK) 4 MG TBPK tablet Take 1-6 tablets by mouth as directed. Reported on 04/27/2015  0  . naproxen sodium (ANAPROX) 220 MG tablet Take 440 mg by mouth daily as needed (neck pain.). Reported on 04/27/2015    . oxyCODONE-acetaminophen (PERCOCET/ROXICET) 5-325 MG tablet Take 1 tablet by mouth every 6 (six) hours as needed for severe pain. (Patient not taking: Reported on 04/27/2015) 15 tablet 0  . predniSONE (DELTASONE) 50 MG tablet Take 1 tablet (50 mg total) by mouth daily with breakfast. (Patient not taking: Reported on 04/27/2015) 5 tablet 0   No current facility-administered medications on file prior to visit.    Review of Systems:  As per HPI- otherwise negative. No SOB, no chest pain No menses due to ablation- negative HCG yesterday Lab Results  Component Value Date   PREGTESTUR NEGATIVE 04/26/2015   PREGSERUM NEGATIVE 08/31/2014      Physical Examination: Filed Vitals:   04/27/15 1502  BP: 125/81  Pulse: 67  Temp: 97.9 F (36.6 C)  Resp: 16   Filed Vitals:   04/27/15 1502  Height: 5\' 6"  (1.676 m)  Weight: 185 lb (83.915 kg)   Body mass index is 29.87 kg/(m^2). Ideal Body Weight: Weight in (lb) to have BMI = 25: 154.6  GEN: WDWN, NAD, Non-toxic, A & O x 3,  overweight, looks well but is tearful with frustration HEENT: Atraumatic, Normocephalic. Neck supple. No masses, No LAD. Ears and Nose: No external deformity. CV: RRR, No M/G/R. No JVD. No thrill. No extra heart sounds. PULM: CTA B, no wheezes, crackles, rhonchi. No retractions. No resp. distress. No accessory muscle use. ABD: S, NT, ND She has tenderness in the left side, over the lower ribs along the T5/6 dermatome.  No rash or skin lesion.  Tenderness seems most consistent with MSK pain. EXTR: No c/c/e NEURO Normal gait.  PSYCH: Normally interactive. Conversant.  Not depressed or anxious appearing.  Calm demeanor.    Assessment and Plan: Left sided abdominal pain - Plan: lidocaine (LIDODERM) 5 %  Persistent left sided pain- it is in her body wall and not truly the abd cavity Suggested that she try the flexeril she was given, and gave an rx for lidoderm patches today She can also try the percocet she was given in lieu of hydrocodone for a short period.   She will let me know if not better soon  Signed Lamar Blinks, MD

## 2015-05-05 ENCOUNTER — Telehealth: Payer: Self-pay

## 2015-05-05 ENCOUNTER — Telehealth: Payer: Self-pay | Admitting: Family Medicine

## 2015-05-05 NOTE — Telephone Encounter (Signed)
Called her back and LMOM- I am not sure what referral she is referencing. Can she please let me know- maybe via mychart.  If she is not ok please seek care

## 2015-05-05 NOTE — Telephone Encounter (Signed)
Pt was seen by Dr. Lorelei Pont on 12/14 for Left sided abdominal pain - Primary. She is still experiencing lots of pain in this area. She would like to know what she can do or if she should move up her referral appointment. CB #  [phone]

## 2015-05-05 NOTE — Telephone Encounter (Signed)
Pt called back- her pain is not any better.  We added lidoderm patches at her last visit. Let her know that I am sorry but I do not know why she is having this pain or how to make it go away. She wonders if she should see her NSG (she is established already) and I encouraged her to do so. Offered to see her back here or have her go to the ER if needed

## 2015-06-03 ENCOUNTER — Encounter: Payer: Self-pay | Admitting: Family Medicine

## 2015-06-07 ENCOUNTER — Other Ambulatory Visit: Payer: Self-pay | Admitting: Neurosurgery

## 2015-06-07 DIAGNOSIS — M542 Cervicalgia: Secondary | ICD-10-CM

## 2015-06-07 DIAGNOSIS — M546 Pain in thoracic spine: Secondary | ICD-10-CM

## 2015-06-08 ENCOUNTER — Encounter: Payer: Self-pay | Admitting: Family Medicine

## 2015-06-22 ENCOUNTER — Ambulatory Visit
Admission: RE | Admit: 2015-06-22 | Discharge: 2015-06-22 | Disposition: A | Discharge status: Home / Self Care | Payer: 59 | Source: Ambulatory Visit | Attending: Neurosurgery | Admitting: Neurosurgery

## 2015-06-22 DIAGNOSIS — M542 Cervicalgia: Secondary | ICD-10-CM

## 2015-06-22 DIAGNOSIS — M546 Pain in thoracic spine: Secondary | ICD-10-CM

## 2016-01-06 ENCOUNTER — Other Ambulatory Visit: Payer: Self-pay | Admitting: Obstetrics & Gynecology

## 2016-01-06 DIAGNOSIS — N644 Mastodynia: Secondary | ICD-10-CM

## 2016-01-06 DIAGNOSIS — N6325 Unspecified lump in the left breast, overlapping quadrants: Secondary | ICD-10-CM

## 2016-01-06 DIAGNOSIS — N6320 Unspecified lump in the left breast, unspecified quadrant: Principal | ICD-10-CM

## 2016-01-12 ENCOUNTER — Ambulatory Visit
Admission: RE | Admit: 2016-01-12 | Discharge: 2016-01-12 | Disposition: A | Discharge status: Home / Self Care | Payer: 59 | Source: Ambulatory Visit | Attending: Obstetrics & Gynecology | Admitting: Obstetrics & Gynecology

## 2016-01-12 ENCOUNTER — Other Ambulatory Visit: Payer: Self-pay | Admitting: Obstetrics & Gynecology

## 2016-01-12 DIAGNOSIS — N6320 Unspecified lump in the left breast, unspecified quadrant: Principal | ICD-10-CM

## 2016-01-12 DIAGNOSIS — N6325 Unspecified lump in the left breast, overlapping quadrants: Secondary | ICD-10-CM

## 2016-01-12 DIAGNOSIS — N644 Mastodynia: Secondary | ICD-10-CM

## 2016-01-12 DIAGNOSIS — N6310 Unspecified lump in the right breast, unspecified quadrant: Secondary | ICD-10-CM

## 2016-05-15 DIAGNOSIS — G588 Other specified mononeuropathies: Secondary | ICD-10-CM | POA: Diagnosis not present

## 2016-12-09 IMAGING — CT CT ABD-PELV W/ CM
2 of 4 series · 17 of 46 positions shown, 19 images · IV contrast (omnipaque)
Comparison: None.

CLINICAL DATA: Left upper quadrant pain intermittent for over 1
year

EXAM:
CT ABDOMEN AND PELVIS WITH CONTRAST
TECHNIQUE: Multidetector CT imaging of the abdomen and pelvis was performed
using the standard protocol following bolus administration of
intravenous contrast.
CONTRAST:  50mL OMNIPAQUE IOHEXOL 300 MG/ML SOLN, 100mL OMNIPAQUE
IOHEXOL 300 MG/ML SOLN

[Series 2: abd/pel with · axial · 0.78mm/px · z∈[+1236,+1656]mm · 14 of 92 slices shown, 16 images]
[im 4/92  soft-tissue]
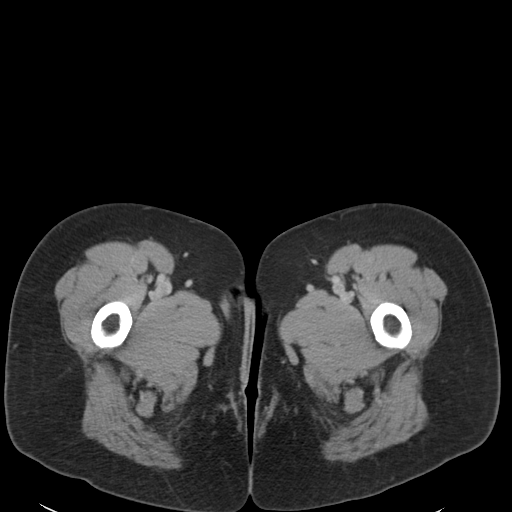
[im 4/92  bone]
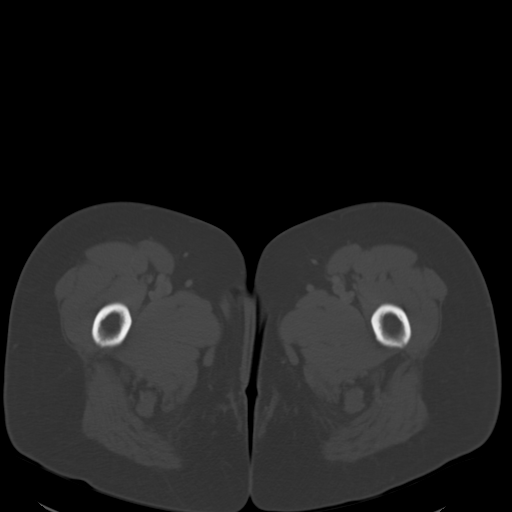
[im 12/92  soft-tissue]
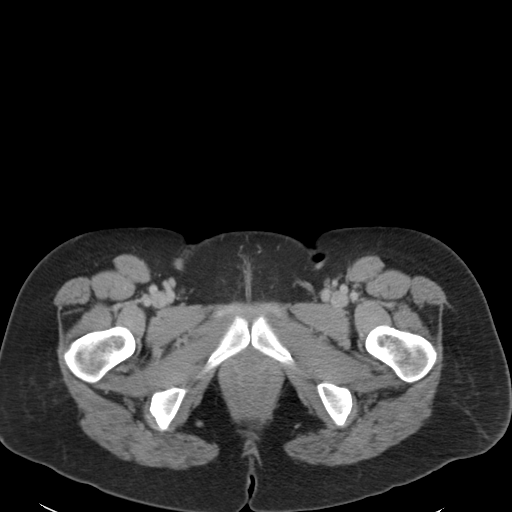
[im 19/92  soft-tissue]
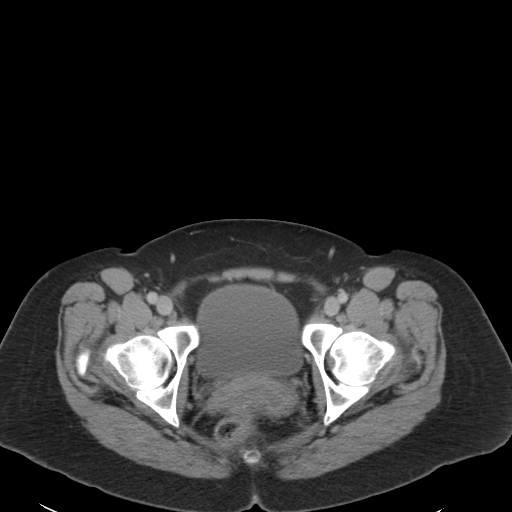
[im 23/92  soft-tissue]
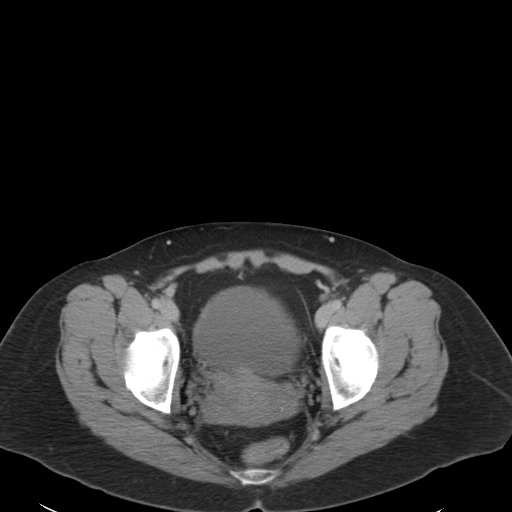
[im 31/92  soft-tissue]
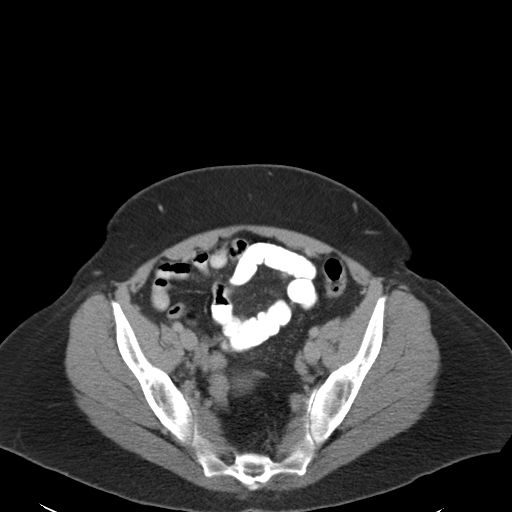
[im 38/92  soft-tissue]
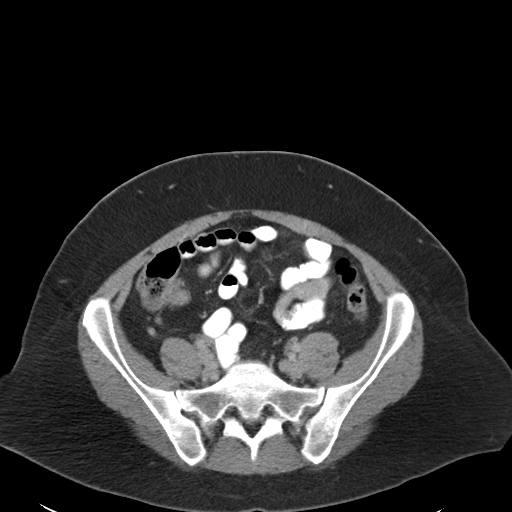
[im 42/92  soft-tissue]
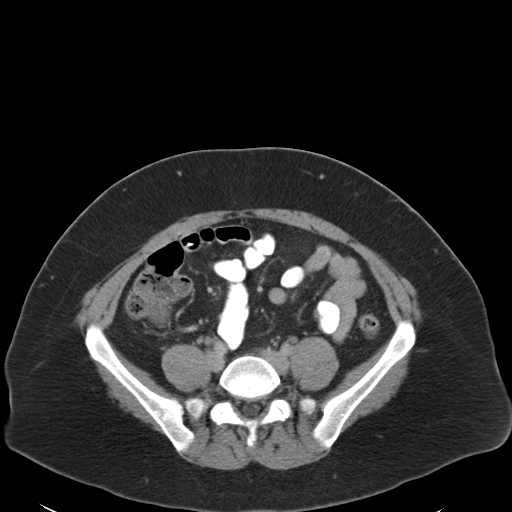
[im 50/92  soft-tissue]
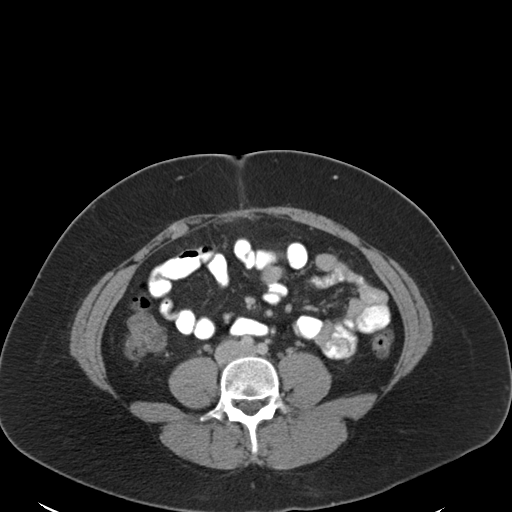
[im 54/92  soft-tissue]
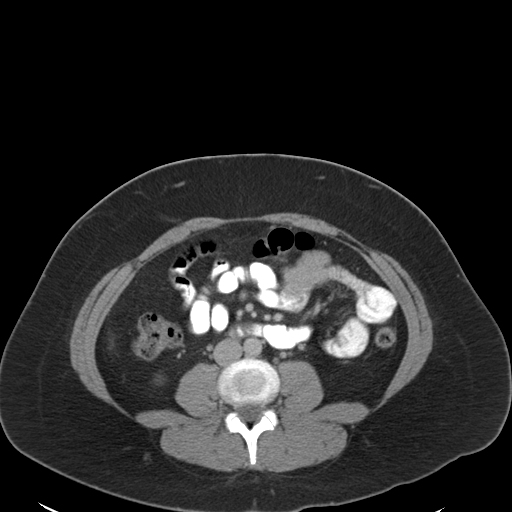
[im 54/92  bone]
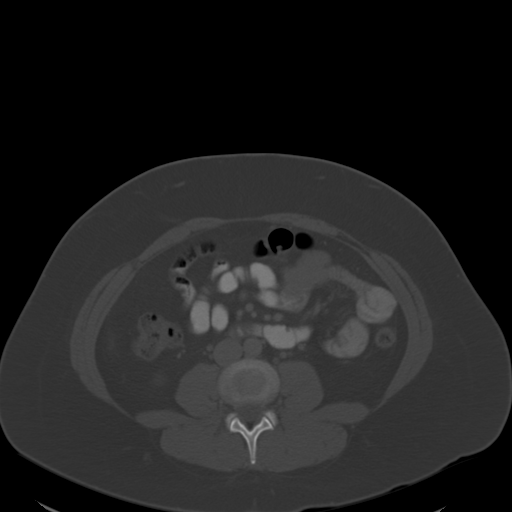
[im 61/92  soft-tissue]
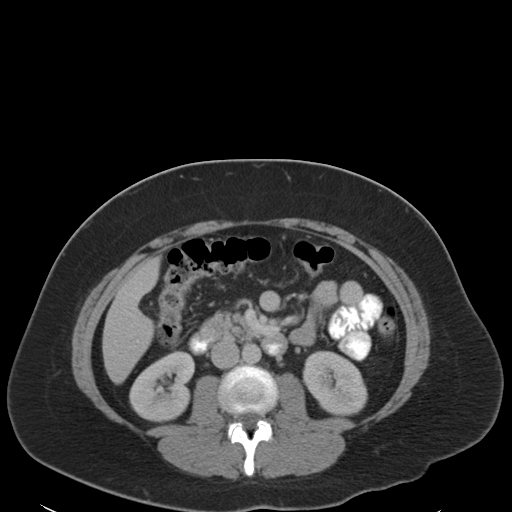
[im 69/92  soft-tissue]
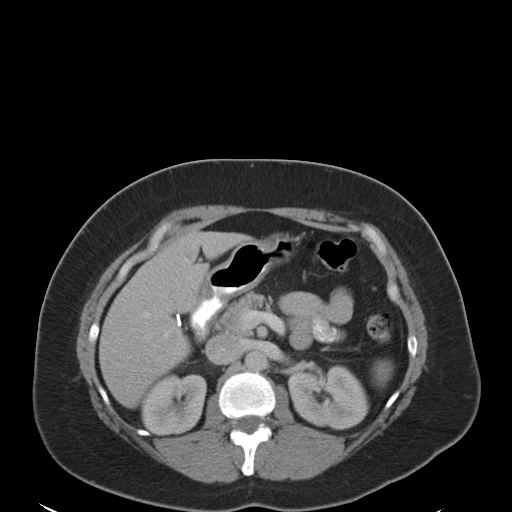
[im 73/92  soft-tissue]
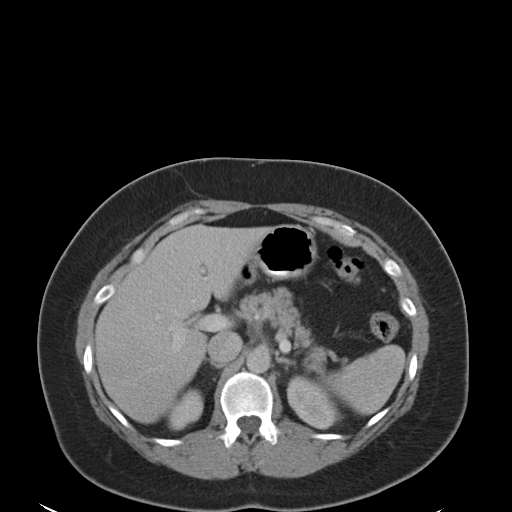
[im 80/92  soft-tissue]
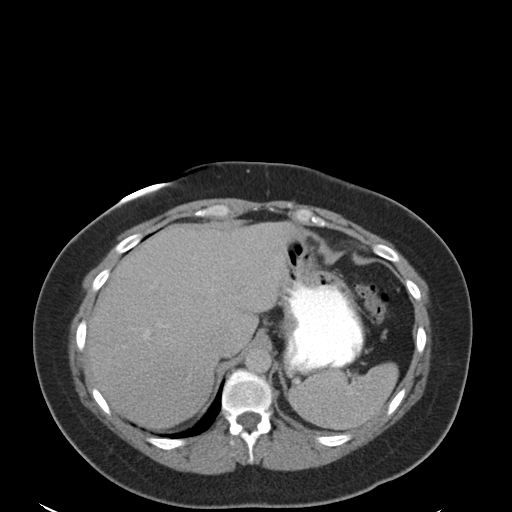
[im 88/92  soft-tissue]
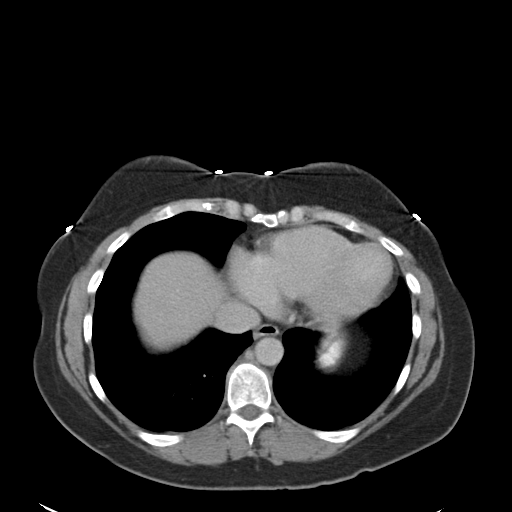

[Series 5: coronal a/|p · coronal · 0.74mm/px · 3 of 93 slices shown]
[im 31/93  soft-tissue]
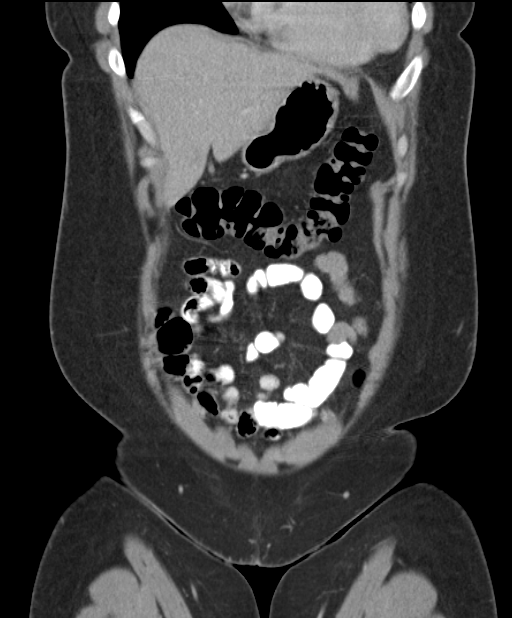
[im 41/93  soft-tissue]
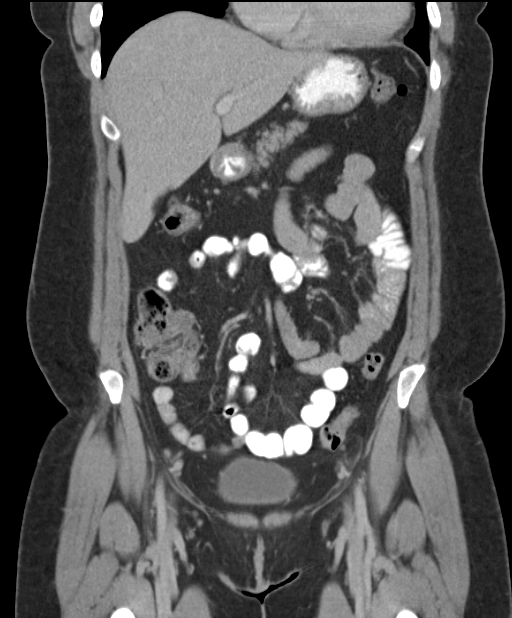
[im 52/93  soft-tissue]
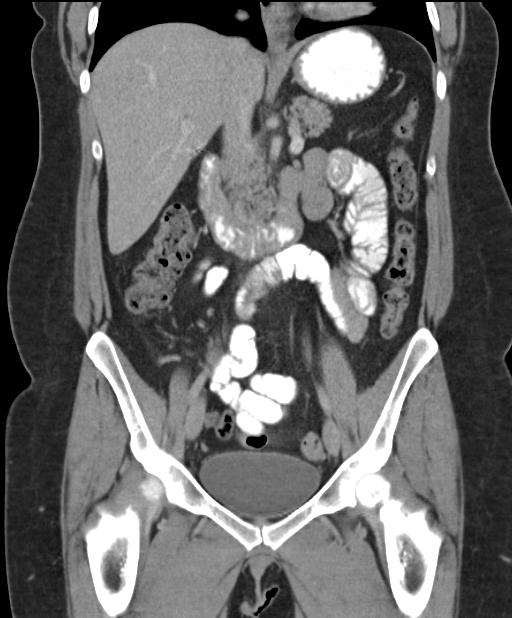

[17 of 46 positions shown; findings below may reference images not displayed]

FINDINGS: Lung bases are free of acute infiltrate or sizable effusion.
Gallbladder has been surgically removed.

The liver, spleen, adrenal glands and pancreas are within normal
limits. Kidneys are well visualized bilaterally. No renal calculi or
urinary tract obstructive changes are seen.

The appendix is within normal limits. Scattered mild diverticular
change is noted without diverticulitis. Bladder is well distended.
The uterus is within normal limits. Small bilateral ovarian cysts
are seen. No free pelvic fluid is noted. The bony structures are
within normal limits.
IMPRESSION: No acute abnormality noted.

## 2018-04-21 ENCOUNTER — Ambulatory Visit: Payer: Self-pay | Admitting: *Deleted

## 2018-04-21 ENCOUNTER — Emergency Department (HOSPITAL_BASED_OUTPATIENT_CLINIC_OR_DEPARTMENT_OTHER)
Admit: 2018-04-21 | Discharge: 2018-04-21 | Disposition: A | Discharge status: Home / Self Care | Payer: 59 | Attending: Emergency Medicine | Admitting: Emergency Medicine

## 2018-04-21 ENCOUNTER — Encounter (HOSPITAL_COMMUNITY): Payer: Self-pay | Admitting: Emergency Medicine

## 2018-04-21 ENCOUNTER — Emergency Department (HOSPITAL_COMMUNITY)
Admission: EM | Admit: 2018-04-21 | Discharge: 2018-04-21 | Disposition: A | Discharge status: Home / Self Care | Payer: 59 | Attending: Emergency Medicine | Admitting: Emergency Medicine

## 2018-04-21 DIAGNOSIS — S7011XA Contusion of right thigh, initial encounter: Secondary | ICD-10-CM

## 2018-04-21 DIAGNOSIS — F17210 Nicotine dependence, cigarettes, uncomplicated: Secondary | ICD-10-CM | POA: Insufficient documentation

## 2018-04-21 DIAGNOSIS — Z79899 Other long term (current) drug therapy: Secondary | ICD-10-CM | POA: Insufficient documentation

## 2018-04-21 DIAGNOSIS — M79604 Pain in right leg: Secondary | ICD-10-CM | POA: Diagnosis present

## 2018-04-21 NOTE — Telephone Encounter (Signed)
Pt reports has "Major cramping" episode of right leg Saturday afternoon. States by Sunday afternoon area was black with "deep redness at center", swelling "Size of softball." Area is at right thigh, above knee, lateral aspect of leg. Pt states is spreading to back of thigh. Area is warm to touch, painful 4-5/10. States last night woke up with both hands tingling and heart racing, "Felt like my heartbeat was irregular."   Denies fever, SOB. Does not recall any injury,insect bite. Pt directed to UC, states will follow disposition. Care advise given per protocol.  Reason for Disposition . [1] Difficulty breathing with exertion (e.g., walking) AND [2] new onset or worsening  Answer Assessment - Initial Assessment Questions 1. ONSET: "When did the swelling start?" (e.g., minutes, hours, days)     Sunday 2. LOCATION: "What part of the leg is swollen?"  "Are both legs swollen or just one leg?"    Area of right leg, thigh above knee, lateral aspect, spreading to back of thigh. 3. SEVERITY: "How bad is the swelling?" (e.g., localized; mild, moderate, severe)  - Localized - small area of swelling localized to one leg  - MILD pedal edema - swelling limited to foot and ankle, pitting edema < 1/4 inch (6 mm) deep, rest and elevation eliminate most or all swelling  - MODERATE edema - swelling of lower leg to knee, pitting edema > 1/4 inch (6 mm) deep, rest and elevation only partially reduce swelling  - SEVERE edema - swelling extends above knee, facial or hand swelling present      "Soft ball size" 4. REDNESS: "Does the swelling look red or infected?"     Black area with red in middle, warm to touch 5. PAIN: "Is the swelling painful to touch?" If so, ask: "How painful is it?"   (Scale 1-10; mild, moderate or severe)     4-5/10 6. FEVER: "Do you have a fever?" If so, ask: "What is it, how was it measured, and when did it start?"      no 7. CAUSE: "What do you think is causing the leg swelling?"      Unsure 8. MEDICAL HISTORY: "Do you have a history of heart failure, kidney disease, liver failure, or cancer?"     no 9. RECURRENT SYMPTOM: "Have you had leg swelling before?" If so, ask: "When was the last time?" "What happened that time?"     no 10. OTHER SYMPTOMS: "Do you have any other symptoms?" (e.g., chest pain, difficulty breathing)       Heart racing last night, not presently. Hands tingling at times 11. PREGNANCY: "Is there any chance you are pregnant?" "When was your last menstrual period?"  Protocols used: LEG SWELLING AND EDEMA-A-AH

## 2018-04-21 NOTE — Discharge Instructions (Signed)
As discussed, your evaluation today has been largely reassuring.  But, it is important that you monitor your condition carefully, and do not hesitate to return to the ED if you develop new, or concerning changes in your condition.  Your pain is likely due to a ruptured blood vessel on the surface of your right leg.  Please use warm compresses, Tylenol and ibuprofen for pain control.  Otherwise, please follow-up with your physician for appropriate ongoing care.

## 2018-04-21 NOTE — ED Provider Notes (Signed)
[facility] EMERGENCY DEPARTMENT Provider Note   CSN: [id number] Arrival date & time: 04/21/18  1435     History   Chief Complaint Chief Complaint  Patient presents with  . Leg Swelling    HPI Taylor Huff is a 37 y.o. female.  HPI Patient presents with concern of pain and swelling in her right medial thigh. Patient recalls sudden onset of discomfort about 48 hours ago, and since that time has had increased pain, as well as discoloration and swelling around a focal lesion on the right medial thigh. No clear precipitant, and since onset no clear alleviating or exacerbating factors. Patient denies other complaints.  Past Medical History:  Diagnosis Date  . Anxiety   . Complication of anesthesia    pt concerned about hyperextending neck due to recent neck injury  . Heart murmur   . History of hiatal hernia   . History of stomach ulcers   . Sleep apnea    with child birth, not now    Patient Active Problem List   Diagnosis Date Noted  . Abdominal pain, left upper quadrant 12/27/2013  . 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 Chronic cholecystitis with calculus 11/11/2013    Past Surgical History:  Procedure Laterality Date  . BACK SURGERY     cervical fusion L4,5,6  . CHOLECYSTECTOMY N/A 11/12/2013   Procedure: LAPAROSCOPIC CHOLECYSTECTOMY POSSIBLE IOC;  Surgeon: Stark Klein, MD;  Location: [facility];  Service: General;  Laterality: N/A;  . DILITATION & CURRETTAGE/HYSTROSCOPY WITH NOVASURE ABLATION N/A 05/05/2014   Procedure: DILATATION & CURETTAGE/HYSTEROSCOPY WITH NOVASURE ABLATION;  Surgeon: Annalee Genta, DO;  Location: [facility];  Service: Gynecology;  Laterality: N/A;  . hemmorhoids    . INSERTION OF MESH N/A 09/16/2014   Procedure: INSERTION OF MESH;  Surgeon: Coralie Keens, MD;  Location: [facility];  Service: General;  Laterality: N/A;  . plantar wart removal     . TUBAL LIGATION    . UMBILICAL HERNIA REPAIR N/A 09/16/2014   Procedure:  HERNIA REPAIR UMBILICAL ADULT with mesh;  Surgeon: Coralie Keens, MD;  Location: [facility];  Service: General;  Laterality: N/A;     OB History   None      Home Medications    Prior to Admission medications   Medication Sig Start Date End Date Taking? Authorizing Provider  ALPRAZolam (XANAX) 1 MG tablet TAKE 1/2 TABLET UP TO TWICE DAILY AS NEEDED FOR ANXIETY/SLEEP Patient not taking: Reported on 04/27/2015 12/27/14   Copland, Gay Filler, MD  cyclobenzaprine (FLEXERIL) 10 MG tablet Take 1 tablet (10 mg total) by mouth at bedtime. 04/26/15   Lawyer, Harrell Gave, PA-C  HYDROcodone-acetaminophen (NORCO) 10-325 MG tablet Take 0.5-1 tablets by mouth every 6 (six) hours as needed for moderate pain.  04/21/15   [provider]  ibuprofen (ADVIL,MOTRIN) 200 MG tablet Take 800 mg by mouth every 6 (six) hours as needed for moderate pain.     [provider]  lidocaine (LIDODERM) 5 % Place 1 patch onto the skin daily. May use up to 3 patches at a time.  Remove & Discard patch within 12 hours 04/27/15   Copland, Gay Filler, MD  loratadine (CLARITIN) 10 MG tablet Take 10 mg by mouth daily as needed for allergies. Reported on 04/27/2015    [provider]  methylPREDNISolone (MEDROL DOSEPAK) 4 MG TBPK tablet Take 1-6 tablets by mouth as directed. Reported on 04/27/2015 04/21/15   [provider]  naproxen sodium (ANAPROX) 220 MG tablet  Take 440 mg by mouth daily as needed (neck pain.). Reported on 04/27/2015    [provider]  oxyCODONE-acetaminophen (PERCOCET/ROXICET) 5-325 MG tablet Take 1 tablet by mouth every 6 (six) hours as needed for severe pain. Patient not taking: Reported on 04/27/2015 04/26/15   Dalia Heading, PA-C  predniSONE (DELTASONE) 50 MG tablet Take 1 tablet (50 mg total) by mouth daily with breakfast. Patient not taking: Reported on 04/27/2015 04/26/15   Dalia Heading, PA-C    Family History Family History  Problem Relation Age of  Onset  . Hypertension Mother     Social History Social History   Tobacco Use  . Smoking status: Current Every Day Smoker    Packs/day: 0.50    Years: 10.00    Pack years: 5.00    Types: Cigarettes  . Smokeless tobacco: Never Used  Substance Use Topics  . Alcohol use: Yes    Comment: socially   . Drug use: No     Allergies   Penicillins   Review of Systems Review of Systems  Constitutional:       Per HPI, otherwise negative  HENT:       Per HPI, otherwise negative  Respiratory:       Per HPI, otherwise negative  Cardiovascular:       Per HPI, otherwise negative  Gastrointestinal: Negative for vomiting.  Endocrine:       Negative aside from HPI  Genitourinary:       Neg aside from HPI   Musculoskeletal:       Per HPI, otherwise negative  Skin: Positive for color change and wound.  Neurological: Negative for syncope.     Physical Exam Updated Vital Signs BP (!) 139/102 (BP Location: Right Arm)   Pulse 93   Temp 98.4 F (36.9 C) (Oral)   Resp 18   SpO2 97%   Physical Exam  Constitutional: She is oriented to person, place, and time. She appears well-developed and well-nourished. No distress.  HENT:  Head: Normocephalic and atraumatic.  Eyes: Conjunctivae and EOM are normal.  Pulmonary/Chest: Effort normal. No stridor. No respiratory distress.  Abdominal: She exhibits no distension.  Musculoskeletal: She exhibits no edema.  Neurological: She is alert and oriented to person, place, and time. No cranial nerve deficit.  Skin: Skin is warm and dry.     Psychiatric: She has a normal mood and affect.  Nursing note and vitals reviewed.    ED Treatments / Results  Labs (all labs ordered are listed, but only abnormal results are displayed) Labs Reviewed - No data to display  EKG None  Radiology Vas [place] Lower Extremity Venous (dvt) (only [facility] 7a-7p)  Result Date: 04/21/2018  Lower Venous Study Indications: Bruising. Other Indications: Recent thigh  cramp. Comparison Study: No prior study on file Performing Technologist: Sharion Dove RVS  Examination Guidelines: A complete evaluation includes B-mode imaging, spectral Doppler, color Doppler, and power Doppler as needed of all accessible portions of each vessel. Bilateral testing is considered an integral part of a complete examination. Limited examinations for reoccurring indications may be performed as noted.  Right Venous Findings: +---------+---------------+---------+-----------+----------+-------+          CompressibilityPhasicitySpontaneityPropertiesSummary +---------+---------------+---------+-----------+----------+-------+ CFV      Full           Yes      Yes                          +---------+---------------+---------+-----------+----------+-------+  SFJ      Full                                                 +---------+---------------+---------+-----------+----------+-------+ FV Prox  Full                                                 +---------+---------------+---------+-----------+----------+-------+ FV Mid   Full                                                 +---------+---------------+---------+-----------+----------+-------+ FV DistalFull                                                 +---------+---------------+---------+-----------+----------+-------+ PFV      Full                                                 +---------+---------------+---------+-----------+----------+-------+ POP      Full           Yes      Yes                          +---------+---------------+---------+-----------+----------+-------+ PTV      Full                                                 +---------+---------------+---------+-----------+----------+-------+ PERO     Full                                                 +---------+---------------+---------+-----------+----------+-------+ GSV      Full                                                  +---------+---------------+---------+-----------+----------+-------+  Left Venous Findings: +---+---------------+---------+-----------+----------+-------+    CompressibilityPhasicitySpontaneityPropertiesSummary +---+---------------+---------+-----------+----------+-------+ CFVFull           Yes      Yes                          +---+---------------+---------+-----------+----------+-------+    Summary: Right: There is no evidence of deep vein thrombosis in the lower extremity. Left: No evidence of common femoral vein obstruction.  *See table(s) above for measurements and observations. Electronically signed by Servando Snare MD on 04/21/2018 at 4:51:02 PM.    Final     Procedures Procedures (including critical care time)  Medications Ordered in ED Medications - No data to display   Initial Impression / Assessment and Plan / ED Course  I have reviewed the triage vital signs and the nursing notes.  Pertinent labs & imaging results that were available during my care of the patient were reviewed by me and considered in my medical decision making (see chart for details).     5:34 PM Patient in no distress, awake, alert. I discussed the patient's ultrasound with the sonographer, and is now with the patient herself. No new complaints, no hemodynamic instability, patient does have pain about the lesion itself, which may be explained by a ruptured varicosity, without other acute findings, without evidence for neurovascular compromise, the patient was discharged in stable condition with outpatient follow-up as needed.  Final Clinical Impressions(s) / ED Diagnoses  Leg pain, right, initial encounter   Carmin Muskrat, MD 04/21/18 1735

## 2018-04-21 NOTE — ED Triage Notes (Signed)
Pt states she had some pain in the right side of her thigh on Saturday and noticed a golf ball size risen bruise and now the knot is the size of a softball.

## 2018-04-21 NOTE — Progress Notes (Signed)
VASCULAR LAB PRELIMINARY  PRELIMINARY  PRELIMINARY  PRELIMINARY  Right lower extremity venous duplex completed.    Preliminary report:  There is no DVT or SVT noted in the right lower extremity.    Gave results to Dr. Ellwood Sayers, [facility], RVT 04/21/2018, 4:09 PM

## 2019-06-12 ENCOUNTER — Other Ambulatory Visit: Payer: Self-pay | Admitting: Neurosurgery

## 2019-06-12 DIAGNOSIS — M5442 Lumbago with sciatica, left side: Secondary | ICD-10-CM

## 2019-07-02 ENCOUNTER — Other Ambulatory Visit: Payer: 59

## 2019-07-20 ENCOUNTER — Other Ambulatory Visit: Payer: Self-pay

## 2019-07-20 ENCOUNTER — Ambulatory Visit
Admission: RE | Admit: 2019-07-20 | Discharge: 2019-07-20 | Disposition: A | Discharge status: Home / Self Care | Payer: 59 | Source: Ambulatory Visit | Attending: Neurosurgery | Admitting: Neurosurgery

## 2019-07-20 DIAGNOSIS — M5442 Lumbago with sciatica, left side: Secondary | ICD-10-CM

## 2020-01-01 ENCOUNTER — Ambulatory Visit
Admission: RE | Admit: 2020-01-01 | Discharge: 2020-01-01 | Disposition: A | Discharge status: Home / Self Care | Payer: 59 | Source: Ambulatory Visit | Attending: Obstetrics & Gynecology | Admitting: Obstetrics & Gynecology

## 2020-01-01 ENCOUNTER — Other Ambulatory Visit: Payer: Self-pay | Admitting: Obstetrics & Gynecology

## 2020-01-01 DIAGNOSIS — R1031 Right lower quadrant pain: Secondary | ICD-10-CM

## 2020-01-01 DIAGNOSIS — R102 Pelvic and perineal pain: Secondary | ICD-10-CM

## 2020-01-01 MED ORDER — IOPAMIDOL (ISOVUE-300) INJECTION 61%: 100.0000 mL | Freq: Once | INTRAVENOUS | Status: DC | PRN

## 2023-01-09 ENCOUNTER — Other Ambulatory Visit: Payer: Self-pay

## 2023-01-09 ENCOUNTER — Encounter (HOSPITAL_BASED_OUTPATIENT_CLINIC_OR_DEPARTMENT_OTHER): Payer: Self-pay | Admitting: Emergency Medicine

## 2023-01-09 ENCOUNTER — Emergency Department (HOSPITAL_BASED_OUTPATIENT_CLINIC_OR_DEPARTMENT_OTHER): Payer: 59

## 2023-01-09 ENCOUNTER — Emergency Department (HOSPITAL_BASED_OUTPATIENT_CLINIC_OR_DEPARTMENT_OTHER)
Admission: EM | Admit: 2023-01-09 | Discharge: 2023-01-09 | Disposition: A | Discharge status: Home / Self Care | Payer: 59 | Attending: Emergency Medicine | Admitting: Emergency Medicine

## 2023-01-09 DIAGNOSIS — M5442 Lumbago with sciatica, left side: Secondary | ICD-10-CM | POA: Insufficient documentation

## 2023-01-09 DIAGNOSIS — M545 Low back pain, unspecified: Secondary | ICD-10-CM | POA: Diagnosis present

## 2023-01-09 DIAGNOSIS — M5432 Sciatica, left side: Secondary | ICD-10-CM

## 2023-01-09 MED ORDER — IBUPROFEN 800 MG PO TABS: 800.0000 mg | ORAL_TABLET | Freq: Three times a day (TID) | ORAL | 0 refills | Status: AC | PRN

## 2023-01-09 MED ORDER — OXYCODONE HCL 5 MG PO TABS: 5.0000 mg | ORAL_TABLET | ORAL | 0 refills | Status: AC | PRN

## 2023-01-09 MED ORDER — HYDROCODONE-ACETAMINOPHEN 5-325 MG PO TABS
1.0000 | ORAL_TABLET | Freq: Once | ORAL | Status: AC
  Administered 2023-01-09: 1 via ORAL
  Filled 2023-01-09: qty 1

## 2023-01-09 MED ORDER — PREDNISONE 50 MG PO TABS
60.0000 mg | ORAL_TABLET | Freq: Once | ORAL | Status: AC
  Administered 2023-01-09: 60 mg via ORAL
  Filled 2023-01-09: qty 1

## 2023-01-09 MED ORDER — KETOROLAC TROMETHAMINE 30 MG/ML IJ SOLN
30.0000 mg | Freq: Once | INTRAMUSCULAR | Status: AC
  Administered 2023-01-09: 30 mg via INTRAMUSCULAR
  Filled 2023-01-09: qty 1

## 2023-01-09 MED ORDER — PREDNISONE 20 MG PO TABS: 20.0000 mg | ORAL_TABLET | Freq: Every day | ORAL | 0 refills | Status: AC

## 2023-01-09 NOTE — ED Notes (Signed)
Pt discharged to home using teachback Method. Discharge instructions have been discussed with patient and/or family members. Pt verbally acknowledges understanding d/c instructions, has been given opportunity for questions to be answered, and endorses comprehension to checkout at registration before leaving.  

## 2023-01-09 NOTE — ED Triage Notes (Signed)
Pt arrives pov, steady gait, c/o lower back pain x 3 days, reports pain worse today

## 2023-01-09 NOTE — Discharge Instructions (Addendum)
Oxycodone 5 mg and Motrin 800 mg sent to pharmacy for pain control.  Prednisone steroid sent to help decrease inflammation for nerve pain.  Please follow recommended stretching exercises at the back of this pamphlet.  Decrease sitting at work by standing or using a yoga ball.

## 2023-01-09 NOTE — ED Provider Notes (Signed)
[facility] EMERGENCY DEPARTMENT AT [facility] Provider Note   CSN: [id number] Arrival date & time: 01/09/23  0751     History  Chief Complaint  Patient presents with   Back Pain    Taylor Huff is a 43 y.o. female.  Patient is a 42 year old female presenting for low back pain.  Patient admits to severe low back pain that started last night that was worsened this morning.  She states the pain is in the center of her lower back and radiates down the left lower extremity.  States she was at home in the shower when she heard a popping sound had severe sharp pain and was unable to move.  She denies any sensation or motor deficits.  Patient able to ambulate but with difficulty due to pain.  She denies any falls, recent trauma, or recent motor vehicle accidents.  No recent spinal injections.  The history is provided by the patient. No language interpreter was used.  Back Pain Associated symptoms: no abdominal pain, no chest pain, no dysuria and no fever        Home Medications Prior to Admission medications   Medication Sig Start Date End Date Taking? Authorizing Provider  ibuprofen (ADVIL) 800 MG tablet Take 1 tablet (800 mg total) by mouth every 8 (eight) hours as needed for moderate pain. 01/09/23  Yes Edwin Dada P, DO  oxyCODONE (ROXICODONE) 5 MG immediate release tablet Take 1 tablet (5 mg total) by mouth every 4 (four) hours as needed for up to 3 days for severe pain. 01/09/23 01/12/23 Yes Edwin Dada P, DO  predniSONE (DELTASONE) 20 MG tablet Take 1 tablet (20 mg total) by mouth daily for 5 days. 01/09/23 01/14/23 Yes Edwin Dada P, DO  ALPRAZolam Prudy Feeler) 1 MG tablet TAKE 1/2 TABLET UP TO TWICE DAILY AS NEEDED FOR ANXIETY/SLEEP Patient not taking: Reported on 04/27/2015 12/27/14   Copland, Gwenlyn Found, MD  cyclobenzaprine (FLEXERIL) 10 MG tablet Take 1 tablet (10 mg total) by mouth at bedtime. 04/26/15   Lawyer, Cristal Deer, PA-C  HYDROcodone-acetaminophen (NORCO) 10-325  MG tablet Take 0.5-1 tablets by mouth every 6 (six) hours as needed for moderate pain.  04/21/15   [provider]  lidocaine (LIDODERM) 5 % Place 1 patch onto the skin daily. May use up to 3 patches at a time.  Remove & Discard patch within 12 hours 04/27/15   Copland, Gwenlyn Found, MD  loratadine (CLARITIN) 10 MG tablet Take 10 mg by mouth daily as needed for allergies. Reported on 04/27/2015    [provider]  methylPREDNISolone (MEDROL DOSEPAK) 4 MG TBPK tablet Take 1-6 tablets by mouth as directed. Reported on 04/27/2015 04/21/15   [provider]  naproxen sodium (ANAPROX) 220 MG tablet Take 440 mg by mouth daily as needed (neck pain.). Reported on 04/27/2015    [provider]  oxyCODONE-acetaminophen (PERCOCET/ROXICET) 5-325 MG tablet Take 1 tablet by mouth every 6 (six) hours as needed for severe pain. Patient not taking: Reported on 04/27/2015 04/26/15   Charlestine Night, PA-C      Allergies    Penicillins    Review of Systems   Review of Systems  Constitutional:  Negative for chills and fever.  HENT:  Negative for ear pain and sore throat.   Eyes:  Negative for pain and visual disturbance.  Respiratory:  Negative for cough and shortness of breath.   Cardiovascular:  Negative for chest pain and palpitations.  Gastrointestinal:  Negative for abdominal pain and  vomiting.  Genitourinary:  Negative for dysuria and hematuria.  Musculoskeletal:  Positive for back pain. Negative for arthralgias.  Skin:  Negative for color change and rash.  Neurological:  Negative for seizures and syncope.  All other systems reviewed and are negative.   Physical Exam Updated Vital Signs BP (!) 142/97   Pulse 84   Temp 97.8 F (36.6 C) (Oral)   Resp 16   Ht 5\' 6"  (1.676 m)   Wt 97.5 kg   SpO2 100%   BMI 34.70 kg/m  Physical Exam Vitals and nursing note reviewed.  Constitutional:      General: She is not in acute distress.    Appearance: She is  well-developed.  HENT:     Head: Normocephalic and atraumatic.  Eyes:     Conjunctiva/sclera: Conjunctivae normal.  Cardiovascular:     Rate and Rhythm: Normal rate and regular rhythm.     Pulses:          Dorsalis pedis pulses are 2+ on the right side and 2+ on the left side.     Heart sounds: No murmur heard. Pulmonary:     Effort: Pulmonary effort is normal. No respiratory distress.     Breath sounds: Normal breath sounds.  Abdominal:     Palpations: Abdomen is soft.     Tenderness: There is no abdominal tenderness.  Musculoskeletal:        General: No swelling.     Cervical back: Neck supple.  Skin:    General: Skin is warm and dry.     Capillary Refill: Capillary refill takes less than 2 seconds.  Neurological:     General: No focal deficit present.     Mental Status: She is alert.     GCS: GCS eye subscore is 4. GCS verbal subscore is 5. GCS motor subscore is 6.     Cranial Nerves: Cranial nerves 2-12 are intact.     Sensory: Sensation is intact.     Motor: Motor function is intact.     Coordination: Coordination is intact.     Gait: Gait is intact.  Psychiatric:        Mood and Affect: Mood normal.     ED Results / Procedures / Treatments   Labs (all labs ordered are listed, but only abnormal results are displayed) Labs Reviewed - No data to display  EKG None  Radiology No results found.  Procedures Procedures    Medications Ordered in ED Medications  HYDROcodone-acetaminophen (NORCO/VICODIN) 5-325 MG per tablet 1 tablet (1 tablet Oral Given 01/09/23 0830)  ketorolac (TORADOL) 30 MG/ML injection 30 mg (30 mg Intramuscular Given 01/09/23 0835)  predniSONE (DELTASONE) tablet 60 mg (60 mg Oral Given 01/09/23 0830)    ED Course/ Medical Decision Making/ A&P                                 Medical Decision Making Amount and/or Complexity of Data Reviewed Radiology: ordered.  Risk Prescription drug management.    Aox3, no acute distress, afebrile,  with stable vitals. No neurovascular deficits. No signs of cauda equina syndrome, spinal epidural abscess, transverse myelitis, hx of trauma, or concerning hx/physical for cancerous etiology. 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 Shallow left subarticular to foraminal disc protrusion at L4-5, closely approximating and potentially irritating the exiting left L4 or descending L5 nerve  roots. 2. Central/right paracentral disc protrusion at L1-2 without significant stenosis or impingement.  Steroids, Norco, Toradol given for pain.  Patient in no distress and overall condition improved here in the ED. Detailed discussions were had with the patient regarding current findings, and need for close f/u with PCP or on call doctor. The patient has been instructed to return immediately if the symptoms worsen in any way for re-evaluation. Patient verbalized understanding and is in agreement with current care plan. All questions answered prior to discharge.          Final Clinical Impression(s) / ED Diagnoses Final diagnoses:  Sciatica of left side    Rx / DC Orders ED Discharge Orders          Ordered    oxyCODONE (ROXICODONE) 5 MG immediate release tablet  Every 4 hours PRN        01/09/23 0948    ibuprofen (ADVIL) 800 MG tablet  Every 8 hours PRN        01/09/23 0948    predniSONE (DELTASONE) 20 MG tablet  Daily        01/09/23 0948              Franne Forts, DO 01/09/23 [phone]
